# Patient Record
Sex: Male | Born: 1970
Health system: Southern US, Community
[De-identification: ages and names within clinical notes are randomized; demographics above are authoritative.]

## PROBLEM LIST (undated history)

## (undated) DIAGNOSIS — F419 Anxiety disorder, unspecified: Secondary | ICD-10-CM

## (undated) HISTORY — PX: REFRACTIVE SURGERY: SHX103

## (undated) HISTORY — DX: Anxiety disorder, unspecified: F41.9

## (undated) HISTORY — PX: VASECTOMY: SHX75

---

## 2005-05-02 HISTORY — PX: VASECTOMY: SHX75

## 2007-02-20 ENCOUNTER — Encounter: Payer: Self-pay | Admitting: Cardiology

## 2009-12-15 ENCOUNTER — Encounter: Payer: Self-pay | Admitting: Cardiology

## 2010-12-09 ENCOUNTER — Encounter: Payer: Self-pay | Admitting: Cardiology

## 2011-01-05 ENCOUNTER — Encounter: Payer: Self-pay | Admitting: Cardiology

## 2011-01-05 ENCOUNTER — Ambulatory Visit (INDEPENDENT_AMBULATORY_CARE_PROVIDER_SITE_OTHER): Payer: BC Managed Care – PPO | Admitting: Cardiology

## 2011-01-05 VITALS — BP 102/74 | HR 67 | Resp 16 | Ht 64.0 in | Wt 134.0 lb

## 2011-01-05 DIAGNOSIS — R9431 Abnormal electrocardiogram [ECG] [EKG]: Secondary | ICD-10-CM

## 2011-01-05 NOTE — Patient Instructions (Addendum)
Follow up as needed  The current medical regimen is effective;  continue present plan and medications.  

## 2011-01-05 NOTE — Assessment & Plan Note (Signed)
The patient has been right axis deviation and mildly on the AP. However, he has absolutely no physical findings to suggest any short external heart disease and in particular no findings consistent with elevated RV pressure or volume. He has no symptoms. He has no risk factors. Given this no further cardiovascular testing is suggested. I did review his EKG and compare to previous and is unchanged.

## 2011-01-05 NOTE — Progress Notes (Signed)
HPI The patient presents for evaluation of an abnormal EKG.  He was found to have RAD and the EKG suggested RVH.  The patient has had no past cardiac history. He has been quite active and works out aerobically 6 days a week. With a high lateral of exercise he denies any cardiovascular symptoms. He denies any chest pressure, neck or arm discomfort. He has no palpitations, presyncope or syncope. He said 25 pound intentional weight loss and exercise and diet. He has no swelling or edema. He has no shortness of breath, PND or orthopnea. An EKG repeated recently does demonstrate a mild right axis deviation probably consistent with his age and body habitus. He had some repolarization changes consistent with his age.  Allergies  Allergen Reactions  . Bee Venom     No current outpatient prescriptions on file.    Past Medical History  Diagnosis Date  . Allergic rhinitis     Past Surgical History  Procedure Date  . Vasectomy     No family history on file.  History   Social History  . Marital Status: N/A    Spouse Name: N/A    Number of Children: 2  . Years of Education: N/A   Occupational History  . [employer]    Social History Main Topics  . Smoking status: Former Games developer  . Smokeless tobacco: Not on file  . Alcohol Use: Not on file  . Drug Use: Not on file  . Sexually Active: Yes -- Male partner(s)   Other Topics Concern  . Not on file   Social History Narrative  . No narrative on file    ROS: Rare dizziness. Otherwise as stated in the HPI and negative for all other systems.  PHYSICAL EXAM BP 102/74  Pulse 67  Resp 16  Ht 5\' 4"  (1.626 m)  Wt 134 lb (60.782 kg)  BMI 23.00 kg/m2 GENERAL:  Well appearing HEENT:  Pupils equal round and reactive, fundi not visualized, oral mucosa unremarkable NECK:  No jugular venous distention, waveform within normal limits, carotid upstroke brisk and symmetric, no bruits, no thyromegaly LYMPHATICS:  No cervical, inguinal  adenopathy LUNGS:  Clear to auscultation bilaterally BACK:  No CVA tenderness CHEST:  Unremarkable HEART:  PMI not displaced or sustained,S1 and S2 within normal limits, no S3, no S4, no clicks, no rubs, no murmurs ABD:  Flat, positive bowel sounds normal in frequency in pitch, no bruits, no rebound, no guarding, no midline pulsatile mass, no hepatomegaly, no splenomegaly EXT:  2 plus pulses throughout, no edema, no cyanosis no clubbing SKIN:  No rashes no nodules NEURO:  Cranial nerves II through XII grossly intact, motor grossly intact throughout PSYCH:  Cognitively intact, oriented to person place and time   EKG:  Size, rate 61, rightward axis, early repolarization changes, no acute ST-T wave changes.  ASSESSMENT AND PLAN

## 2011-01-24 ENCOUNTER — Institutional Professional Consult (permissible substitution): Payer: Self-pay | Admitting: Cardiovascular Disease

## 2011-01-27 ENCOUNTER — Encounter: Payer: Self-pay | Admitting: Cardiology

## 2012-07-30 ENCOUNTER — Telehealth: Payer: Self-pay | Admitting: Nurse Practitioner

## 2012-07-30 ENCOUNTER — Other Ambulatory Visit: Payer: Self-pay | Admitting: Nurse Practitioner

## 2012-07-30 MED ORDER — VALACYCLOVIR HCL 1 G PO TABS: ORAL_TABLET | ORAL | Status: DC

## 2012-07-30 NOTE — Telephone Encounter (Signed)
Please advise 

## 2012-07-30 NOTE — Telephone Encounter (Signed)
[facility] [place]. Pts wife notified

## 2012-08-15 ENCOUNTER — Other Ambulatory Visit: Payer: Self-pay | Admitting: *Deleted

## 2012-08-15 MED ORDER — VALACYCLOVIR HCL 1 G PO TABS: ORAL_TABLET | ORAL | Status: DC

## 2013-02-20 ENCOUNTER — Ambulatory Visit: Payer: Self-pay

## 2013-09-10 ENCOUNTER — Other Ambulatory Visit: Payer: Self-pay

## 2013-09-10 MED ORDER — VALACYCLOVIR HCL 1 G PO TABS: ORAL_TABLET | ORAL | Status: DC

## 2013-09-10 NOTE — Telephone Encounter (Signed)
Not seen since EPIC 

## 2014-03-20 ENCOUNTER — Ambulatory Visit (INDEPENDENT_AMBULATORY_CARE_PROVIDER_SITE_OTHER): Payer: BC Managed Care – PPO

## 2014-03-20 DIAGNOSIS — Z23 Encounter for immunization: Secondary | ICD-10-CM

## 2014-08-01 ENCOUNTER — Other Ambulatory Visit: Payer: Self-pay | Admitting: Nurse Practitioner

## 2014-08-16 ENCOUNTER — Other Ambulatory Visit: Payer: Self-pay | Admitting: Nurse Practitioner

## 2014-08-22 ENCOUNTER — Ambulatory Visit (INDEPENDENT_AMBULATORY_CARE_PROVIDER_SITE_OTHER): Payer: BLUE CROSS/BLUE SHIELD | Admitting: Physician Assistant

## 2014-08-22 ENCOUNTER — Encounter: Payer: Self-pay | Admitting: Physician Assistant

## 2014-08-22 VITALS — BP 131/86 | HR 64 | Temp 97.4°F | Ht 64.0 in | Wt 151.0 lb

## 2014-08-22 DIAGNOSIS — B001 Herpesviral vesicular dermatitis: Secondary | ICD-10-CM

## 2014-08-22 MED ORDER — VALACYCLOVIR HCL 1 G PO TABS: ORAL_TABLET | ORAL | Status: DC

## 2014-08-22 NOTE — Progress Notes (Signed)
   Subjective:    Patient ID: Eric Gomez, male    DOB: 02/21/1971, 44 y.o.   MRN: [id number]  HPI 44 y/o male presents with c/o intermittent cold sores for several years. He hasn't been seen in office since 2013.    Review of Systems  Skin:       Ulcerative lesion on right lower lip with crusting        Objective:   Physical Exam  Skin:  Ulcerative, vesicular lesion on right lower lip with crusting           Assessment & Plan:  1. Herpes labialis  - valACYclovir (VALTREX) 1000 MG tablet; 2 po bid x 1 day  Dispense: 60 tablet; Refill: 1   RTO prn   Antaniya Venuti A. Chauncey ReadingGann PA-C

## 2014-12-25 ENCOUNTER — Encounter: Payer: Self-pay | Admitting: Family Medicine

## 2014-12-25 ENCOUNTER — Ambulatory Visit (INDEPENDENT_AMBULATORY_CARE_PROVIDER_SITE_OTHER): Payer: 59 | Admitting: Family Medicine

## 2014-12-25 VITALS — BP 122/83 | HR 60 | Temp 97.4°F | Ht 64.0 in | Wt 153.4 lb

## 2014-12-25 DIAGNOSIS — J0100 Acute maxillary sinusitis, unspecified: Secondary | ICD-10-CM

## 2014-12-25 MED ORDER — AMOXICILLIN-POT CLAVULANATE 875-125 MG PO TABS: 1.0000 | ORAL_TABLET | Freq: Two times a day (BID) | ORAL | Status: DC

## 2014-12-25 MED ORDER — PSEUDOEPHEDRINE-GUAIFENESIN ER 120-1200 MG PO TB12: 1.0000 | ORAL_TABLET | Freq: Two times a day (BID) | ORAL | Status: DC

## 2014-12-25 NOTE — Progress Notes (Signed)
Subjective:  Patient ID: Eric Gomez, male    DOB: [birth year]  Age: 44 y.o. MRN: [id number]  CC: URI   HPI Eric Gomez presents for 5 day of cough and congestion. No fever. Pt. used nasal wash with some success. Denies ear pain. Some facial pain. Scratchy throat   History Eric Gomez has a past medical history of Allergic rhinitis.   He has past surgical history that includes Vasectomy.   His family history is not on file.He reports that he has quit smoking. He does not have any smokeless tobacco history on file. His alcohol and drug histories are not on file.  Outpatient Prescriptions Prior to Visit  Medication Sig Dispense Refill  . valACYclovir (VALTREX) 1000 MG tablet 2 po bid x 1 day 60 tablet 1   No facility-administered medications prior to visit.    ROS Review of Systems  Constitutional: Negative for fever, chills, activity change and appetite change.  HENT: Positive for congestion, postnasal drip, rhinorrhea and sinus pressure. Negative for ear discharge, ear pain, hearing loss, nosebleeds, sneezing and trouble swallowing.   Respiratory: Negative for chest tightness and shortness of breath.   Cardiovascular: Negative for chest pain and palpitations.  Skin: Negative for rash.    Objective:  BP 122/83 mmHg  Pulse 60  Temp(Src) 97.4 F (36.3 C) (Oral)  Ht 5\' 4"  (1.626 m)  Wt 153 lb 6.4 oz (69.582 kg)  BMI 26.32 kg/m2  BP Readings from Last 3 Encounters:  12/25/14 122/83  08/22/14 131/86  01/05/11 102/74    Wt Readings from Last 3 Encounters:  12/25/14 153 lb 6.4 oz (69.582 kg)  08/22/14 151 lb (68.493 kg)  01/05/11 134 lb (60.782 kg)     Physical Exam  Constitutional: He appears well-developed and well-nourished.  HENT:  Head: Normocephalic and atraumatic.  Right Ear: Tympanic membrane and external ear normal. No decreased hearing is noted.  Left Ear: Tympanic membrane and external ear normal. No decreased hearing is noted.  Nose: Mucosal edema  present. Right sinus exhibits no frontal sinus tenderness. Left sinus exhibits no frontal sinus tenderness.  Mouth/Throat: No oropharyngeal exudate or posterior oropharyngeal erythema.  Neck: No Brudzinski's sign noted.  Pulmonary/Chest: Breath sounds normal. No respiratory distress.  Lymphadenopathy:       Head (right side): No preauricular adenopathy present.       Head (left side): No preauricular adenopathy present.       Right cervical: No superficial cervical adenopathy present.      Left cervical: No superficial cervical adenopathy present.    No results found for: HGBA1C  No results found for: WBC, HGB, HCT, PLT, GLUCOSE, CHOL, TRIG, HDL, LDLDIRECT, LDLCALC, ALT, AST, NA, K, CL, CREATININE, BUN, CO2, TSH, PSA, INR, GLUF, HGBA1C, MICROALBUR  Patient was never admitted.  Assessment & Plan:   Boone was seen today for uri.  Diagnoses and all orders for this visit:  Acute maxillary sinusitis, recurrence not specified  Other orders -     amoxicillin-clavulanate (AUGMENTIN) 875-125 MG per tablet; Take 1 tablet by mouth 2 (two) times daily. Take all of this medication -     Pseudoephedrine-Guaifenesin [phone] MG TB12; Take 1 tablet by mouth 2 (two) times daily. For congeestion   I am having Eric Gomez start on amoxicillin-clavulanate and Pseudoephedrine-Guaifenesin. I am also having him maintain his valACYclovir.  Meds ordered this encounter  Medications  . amoxicillin-clavulanate (AUGMENTIN) 875-125 MG per tablet    Sig: Take 1 tablet by mouth 2 (two) times daily.  Take all of this medication    Dispense:  20 tablet    Refill:  0  . Pseudoephedrine-Guaifenesin [phone] MG TB12    Sig: Take 1 tablet by mouth 2 (two) times daily. For congeestion    Dispense:  20 each    Refill:  0     Follow-up: Return if symptoms worsen or fail to improve, for CPE.  Eric Gomez, M.D.

## 2015-07-06 ENCOUNTER — Telehealth: Payer: Self-pay | Admitting: Nurse Practitioner

## 2015-07-06 NOTE — Telephone Encounter (Signed)
lmtcb

## 2015-07-06 NOTE — Telephone Encounter (Signed)
Pt called c/o of what he things is a sinus infection. Pt was seen 6 months ago but wanted me to ask you if you could call in a rx. He is having facial sinus pressure, nasal congestion and drainage. Pt states that if needed he will be seen but he really didn't want to miss work. Advised pt that i would ask you. Please advise

## 2015-07-06 NOTE — Telephone Encounter (Signed)
Has not been seen in 6 months- NTBS

## 2015-07-07 NOTE — Telephone Encounter (Signed)
Left detailed message that he will need to schedule an appt for evaluation.

## 2016-01-11 ENCOUNTER — Ambulatory Visit (INDEPENDENT_AMBULATORY_CARE_PROVIDER_SITE_OTHER): Payer: 59 | Admitting: Family Medicine

## 2016-01-11 ENCOUNTER — Encounter: Payer: Self-pay | Admitting: Family Medicine

## 2016-01-11 VITALS — BP 130/81 | HR 93 | Temp 97.2°F | Ht 64.0 in | Wt 167.0 lb

## 2016-01-11 DIAGNOSIS — J019 Acute sinusitis, unspecified: Secondary | ICD-10-CM

## 2016-01-11 DIAGNOSIS — M20012 Mallet finger of left finger(s): Secondary | ICD-10-CM | POA: Diagnosis not present

## 2016-01-11 MED ORDER — FLUTICASONE PROPIONATE 50 MCG/ACT NA SUSP: 1.0000 | Freq: Two times a day (BID) | NASAL | 6 refills | Status: DC | PRN

## 2016-01-11 NOTE — Progress Notes (Signed)
BP 130/81   Pulse 93   Temp 97.2 F (36.2 C) (Oral)   Ht 5\' 4"  (1.626 m)   Wt 167 lb (75.8 kg)   BMI 28.67 kg/m    Subjective:    Patient ID: Eric Gomez, male    DOB: Feb 12, 1971, 45 y.o.   MRN: [id number]  HPI: Eric Gomez is a 45 y.o. male presenting on 01/11/2016 for Sinusitis (sinus congestion and pressure, ear pain, headache, drainage, sore throat)   HPI Sinus congestion and ear pressure Patient has been having sinus congestion and ear pressure that is been going on for the past 2 days. He feels like it is been worsening now is getting drainage down the back of his throat starting to get a sore throat. He denies any fevers or chills or shortness of breath or wheezing. He has tried some over-the-counter Tylenol and ibuprofen but nothing else to this point. He is coming in mainly because last time he waited and it got so bad antibiotic and is hoping to catch it before that point. He denies any sick contacts that he knows of.  Relevant past medical, surgical, family and social history reviewed and updated as indicated. Interim medical history since our last visit reviewed. Allergies and medications reviewed and updated.  Review of Systems  Constitutional: Negative for chills and fever.  HENT: Positive for congestion, postnasal drip, rhinorrhea, sinus pressure, sneezing and sore throat. Negative for ear discharge, ear pain and voice change.   Eyes: Negative for pain, discharge, redness and visual disturbance.  Respiratory: Positive for cough. Negative for shortness of breath and wheezing.   Cardiovascular: Negative for chest pain and leg swelling.  Musculoskeletal: Negative for back pain and gait problem.  Skin: Negative for rash.  All other systems reviewed and are negative.   Per HPI unless specifically indicated above     Medication List       Accurate as of 01/11/16  6:46 PM. Always use your most recent med list.          fluticasone 50 MCG/ACT nasal  spray Commonly known as:  FLONASE Place 1 spray into both nostrils 2 (two) times daily as needed for allergies or rhinitis.          Objective:    BP 130/81   Pulse 93   Temp 97.2 F (36.2 C) (Oral)   Ht 5\' 4"  (1.626 m)   Wt 167 lb (75.8 kg)   BMI 28.67 kg/m   Wt Readings from Last 3 Encounters:  01/11/16 167 lb (75.8 kg)  12/25/14 153 lb 6.4 oz (69.6 kg)  08/22/14 151 lb (68.5 kg)    Physical Exam  Constitutional: He is oriented to person, place, and time. He appears well-developed and well-nourished. No distress.  HENT:  Right Ear: Tympanic membrane, external ear and ear canal normal.  Left Ear: Tympanic membrane, external ear and ear canal normal.  Nose: Mucosal edema and rhinorrhea present. No sinus tenderness. No epistaxis. Right sinus exhibits maxillary sinus tenderness. Right sinus exhibits no frontal sinus tenderness. Left sinus exhibits maxillary sinus tenderness. Left sinus exhibits no frontal sinus tenderness.  Mouth/Throat: Uvula is midline and mucous membranes are normal. Posterior oropharyngeal edema and posterior oropharyngeal erythema present. No oropharyngeal exudate or tonsillar abscesses.  Eyes: Conjunctivae and EOM are normal. Pupils are equal, round, and reactive to light. Right eye exhibits no discharge. No scleral icterus.  Neck: Neck supple. No thyromegaly present.  Cardiovascular: Normal rate, regular rhythm, normal heart sounds and  intact distal pulses.   No murmur heard. Pulmonary/Chest: Effort normal and breath sounds normal. No respiratory distress. He has no wheezes. He has no rales.  Musculoskeletal: Normal range of motion. He exhibits no edema.  Lymphadenopathy:    He has no cervical adenopathy.  Neurological: He is alert and oriented to person, place, and time. Coordination normal.  Skin: Skin is warm and dry. No rash noted. He is not diaphoretic.  Psychiatric: He has a normal mood and affect. His behavior is normal.  Nursing note and vitals  reviewed.   No results found for this or any previous visit.    Assessment & Plan:   Problem List Items Addressed This Visit    None    Visit Diagnoses    Acquired mallet finger of left hand    -  Primary   Left ring finger, place splint, instructed patient to wear for 6 weeks all of the time   Acute rhinosinusitis       Relevant Medications   fluticasone (FLONASE) 50 MCG/ACT nasal spray       Follow up plan: Return in about 6 weeks (around 02/22/2016), or if symptoms worsen or fail to improve, for Follow-up mouth finger.  Counseling provided for all of the vaccine components No orders of the defined types were placed in this encounter.   Arville CareJoshua Jaykwon Morones, MD [facility] 01/11/2016, 6:46 PM

## 2016-01-13 ENCOUNTER — Telehealth: Payer: Self-pay | Admitting: *Deleted

## 2016-01-13 NOTE — Telephone Encounter (Signed)
Pt in no better - needs some relief: Symptoms:  Cough Congestion - dark yellow/ white Sinus pressure- bad Sore throat from drainage No fever   Seen Monday - dettinger - gave flonase - using the mucinex and other otc -- [facility] and may need work note. Pt aware he will get a call on Thursday AM

## 2016-01-14 MED ORDER — AZITHROMYCIN 250 MG PO TABS: ORAL_TABLET | ORAL | 0 refills | Status: DC

## 2016-01-14 NOTE — Telephone Encounter (Signed)
Pt aware.

## 2016-02-05 ENCOUNTER — Other Ambulatory Visit: Payer: Self-pay | Admitting: Nurse Practitioner

## 2016-04-01 ENCOUNTER — Ambulatory Visit (INDEPENDENT_AMBULATORY_CARE_PROVIDER_SITE_OTHER): Payer: 59 | Admitting: Family Medicine

## 2016-04-01 ENCOUNTER — Encounter: Payer: Self-pay | Admitting: Family Medicine

## 2016-04-01 VITALS — BP 125/81 | HR 82 | Temp 98.0°F | Ht 64.0 in | Wt 164.2 lb

## 2016-04-01 DIAGNOSIS — M25561 Pain in right knee: Secondary | ICD-10-CM | POA: Diagnosis not present

## 2016-04-01 MED ORDER — DICLOFENAC SODIUM 75 MG PO TBEC: 75.0000 mg | DELAYED_RELEASE_TABLET | Freq: Two times a day (BID) | ORAL | 0 refills | Status: DC

## 2016-04-01 NOTE — Progress Notes (Signed)
   HPI  Patient presents today with right knee pain.  Patient explains that he had slight knee soreness over the last day or 2, however last night around 10 PM his right knee began to hurt severely. He describes dull achy persistent anterior knee pain. Worse with walking or bending the knee. He denies any injury, he did work on his knees about one week ago on carpet.  He has not had similar pain before.  He denies any recent increased meat consumption or alcohol use.  The current operative situation, he was offered a job and will need to get a physical exam within 2 days of except in the jaw so he needs a note for work.  PMH: Smoking status noted ROS: Per HPI  Objective: BP 125/81   Pulse 82   Temp 98 F (36.7 C) (Oral)   Ht 5\' 4"  (1.626 m)   Wt 164 lb 3.2 oz (74.5 kg)   BMI 28.18 kg/m  Gen: NAD, alert, cooperative with exam HEENT: NCAT CV: RRR, good S1/S2, no murmur Resp: CTABL, no wheezes, non-labored Ext: No edema, warm Neuro: Alert and oriented, No gross deficits  MSK: R knee without effusion, bruising, or gross deformity No joint line tenderness.  Mild erythema and warmth compared to L ligamentously intact to Lachman's and with varus and valgus stress.  Negative McMurray's test   Assessment and plan:  # Right knee pain Acute right knee pain onset in last night. Very mild erythema and warmth, consider unusual gout flare, also consider quadriceps tendinitis given distribution of pain in the anterior knee. NSAIDs, supportive care discussed Note written for his job offer Offered x-ray, however he will wait to see if this does not improve.   Meds ordered this encounter  Medications  . diclofenac (VOLTAREN) 75 MG EC tablet    Sig: Take 1 tablet (75 mg total) by mouth 2 (two) times daily.    Dispense:  28 tablet    Refill:  0    Murtis SinkSam Takita Riecke, MD [facility] 04/01/2016, 8:53 AM

## 2016-04-01 NOTE — Patient Instructions (Addendum)
Great to meet you!  Try the medication I gave your 2 times daily for at least 3-5 days, you may take it for up to 2 weeks Also try ice for 15 minutes 3-4 times a day, elevating the knee, and compression ( a knee sleeve or ace bandage is good).   Come back early next week if you are not improving as expected.   Get help right away if you develop fever or the redness is spreading in your leg.

## 2016-10-20 ENCOUNTER — Ambulatory Visit (INDEPENDENT_AMBULATORY_CARE_PROVIDER_SITE_OTHER): Payer: Commercial Managed Care - PPO | Admitting: Family Medicine

## 2016-10-20 ENCOUNTER — Encounter: Payer: Self-pay | Admitting: Family Medicine

## 2016-10-20 VITALS — BP 134/89 | HR 67 | Temp 98.1°F | Ht 64.0 in | Wt 168.0 lb

## 2016-10-20 DIAGNOSIS — L03113 Cellulitis of right upper limb: Secondary | ICD-10-CM | POA: Diagnosis not present

## 2016-10-20 MED ORDER — SULFAMETHOXAZOLE-TRIMETHOPRIM 800-160 MG PO TABS: 1.0000 | ORAL_TABLET | Freq: Two times a day (BID) | ORAL | 0 refills | Status: DC

## 2016-10-20 NOTE — Progress Notes (Signed)
BP 134/89   Pulse 67   Temp 98.1 F (36.7 C) (Oral)   Ht 5\' 4"  (1.626 m)   Wt 168 lb (76.2 kg)   BMI 28.84 kg/m    Subjective:    Patient ID: Eric Gomez, male    DOB: [birth year], 46 y.o.   MRN: [id number]  HPI: Eric Gomez is a 46 y.o. male presenting on 10/20/2016 for Elbow Pain (pt here today c/o right elbow pain/swelling with redness around the elbow and down the forearm. Pt states it is improving from a few nights ago.)   HPI Elbow pain Patient has developed significant right elbow pain that overlying the skin on the back surface of his elbow. It started a few days ago from a small cut that he had on his elbow. He says the overlying skin on that part of the elbow has been inflamed and painful. He denies any fevers or chills. He says it has come down over the past couple days with ibuprofen. He denies any loss of range of motion or pain with range of motion.  Relevant past medical, surgical, family and social history reviewed and updated as indicated. Interim medical history since our last visit reviewed. Allergies and medications reviewed and updated.  Review of Systems  Constitutional: Negative for chills and fever.  Respiratory: Negative for shortness of breath and wheezing.   Cardiovascular: Negative for chest pain and leg swelling.  Musculoskeletal: Negative for back pain and gait problem.  Skin: Positive for color change and wound. Negative for rash.  All other systems reviewed and are negative.   Per HPI unless specifically indicated above        Objective:    BP 134/89   Pulse 67   Temp 98.1 F (36.7 C) (Oral)   Ht 5\' 4"  (1.626 m)   Wt 168 lb (76.2 kg)   BMI 28.84 kg/m   Wt Readings from Last 3 Encounters:  10/20/16 168 lb (76.2 kg)  04/01/16 164 lb 3.2 oz (74.5 kg)  01/11/16 167 lb (75.8 kg)    Physical Exam  Constitutional: He is oriented to person, place, and time. He appears well-developed and well-nourished. No distress.  Eyes:  Conjunctivae are normal. No scleral icterus.  Musculoskeletal: Normal range of motion. He exhibits no edema or tenderness.  Neurological: He is alert and oriented to person, place, and time. Coordination normal.  Skin: Skin is warm and dry. No rash noted. He is not diaphoretic. There is erythema (Erythema overlying the right posterior aspect of the elbow, no inflammation, able to move joint without pain, palpitation overlying skin is tender. Patient has small cut that may have been where the wound started as tender around the cut. Appears to be hea).  Psychiatric: He has a normal mood and affect. His behavior is normal.  Nursing note and vitals reviewed.  appears to be healing well  No results found for this or any previous visit.    Assessment & Plan:   Problem List Items Addressed This Visit    None    Visit Diagnoses    Cellulitis of right upper extremity    -  Primary   Overlying right elbow, some concern that it may be in the bursa sac, will try with antibiotic but it does not improve may need orthopedic referral   Relevant Medications   sulfamethoxazole-trimethoprim (BACTRIM DS,SEPTRA DS) 800-160 MG tablet       Follow up plan: Return if symptoms worsen or fail to improve.  Counseling provided for all of the vaccine components No orders of the defined types were placed in this encounter.   Arville CareJoshua Tupac Jeffus, MD [facility] 10/20/2016, 9:07 AM

## 2016-10-21 ENCOUNTER — Encounter: Payer: Self-pay | Admitting: Physician Assistant

## 2016-10-21 ENCOUNTER — Ambulatory Visit: Payer: Commercial Managed Care - PPO | Admitting: Physician Assistant

## 2016-10-21 ENCOUNTER — Ambulatory Visit (INDEPENDENT_AMBULATORY_CARE_PROVIDER_SITE_OTHER): Payer: Commercial Managed Care - PPO | Admitting: Physician Assistant

## 2016-10-21 VITALS — BP 128/87 | HR 70 | Temp 97.5°F | Ht 64.0 in | Wt 165.8 lb

## 2016-10-21 DIAGNOSIS — M25521 Pain in right elbow: Secondary | ICD-10-CM | POA: Insufficient documentation

## 2016-10-21 MED ORDER — INDOMETHACIN 50 MG PO CAPS: 50.0000 mg | ORAL_CAPSULE | Freq: Three times a day (TID) | ORAL | 0 refills | Status: DC | PRN

## 2016-10-21 NOTE — Patient Instructions (Signed)

## 2016-10-21 NOTE — Progress Notes (Signed)
Subjective:     Patient ID: Eric Gomez, male   DOB: 04/24/1971, 46 y.o.   MRN: [id number]  HPI Pt here for f/u of  R elbow pain Recently seen for same- see note He noted a increase in swelling after work this am Question possible injection to the elbow Has used some Voltaren with some relief today  Review of Systems + redness, swelling, and pain to the elbow No distal numbness     Objective:   Physical Exam + erythema and edema to the R elbow Erythema is diffuse No induration No sig bursa edema seen FROM of the elbow Good grip strength distal Pulses/sensory good distal Uric Acid pending    Assessment:     1. Right elbow pain        Plan:     Discussed with pt I did not want to do injection due to possible infection He denies any FH of gout  Cool compresses Continue with ATB Hold Voltaren and trial of Indocin 50mg  tid prn Will inform of lab results Hydrate well F/U prn

## 2016-10-22 LAB — URIC ACID: Uric Acid: 6.3 mg/dL (ref 3.7–8.6)

## 2016-10-31 NOTE — Progress Notes (Signed)
Patient aware.

## 2017-01-13 ENCOUNTER — Encounter: Payer: Self-pay | Admitting: Family Medicine

## 2017-01-13 ENCOUNTER — Ambulatory Visit (INDEPENDENT_AMBULATORY_CARE_PROVIDER_SITE_OTHER): Payer: Commercial Managed Care - PPO | Admitting: Family Medicine

## 2017-01-13 DIAGNOSIS — F339 Major depressive disorder, recurrent, unspecified: Secondary | ICD-10-CM | POA: Insufficient documentation

## 2017-01-13 MED ORDER — FLUOXETINE HCL 20 MG PO TABS: 20.0000 mg | ORAL_TABLET | Freq: Every day | ORAL | 1 refills | Status: DC

## 2017-01-13 MED ORDER — HYDROXYZINE HCL 25 MG PO TABS: 25.0000 mg | ORAL_TABLET | Freq: Three times a day (TID) | ORAL | 0 refills | Status: DC | PRN

## 2017-01-13 NOTE — Progress Notes (Signed)
BP 128/81   Pulse 97   Temp (!) 97.4 F (36.3 C) (Oral)   Ht  (1.626 m)   Wt 163 lb (73.9 kg)   BMI 27.98 kg/m    Subjective:    Patient ID: Eric Gomez, male    DOB: 05/06/1970, 46 y.o.   MRN: [id number]  HPI: Eric Gomez is a 46 y.o. male presenting on 01/13/2017 for Depression   HPI Anxiety and depression Patient is coming in today to discuss anxiety and depression. He says this is something that he has follow-up with prolonged time in his life but has recently worsened over the past year. He lost his mother just within the past year and has had some otherwise difficulties that it brought it down and he just feels sad most of the time and has crying episodes and spells and just does not feel like himself. He says he also has some panic attacks and anxiety attacks that have been going on. He feels hopeless and helpless and has thoughts of death but no suicidal ideations. He says that he has to much to live for and that is what is bringing him in today. He has close family and children and a wife that he lives with that he supports to keep him going on a day-to-day basis. Depression screen [facility] 2/9 01/13/2017 10/21/2016 10/20/2016 04/01/2016 01/11/2016  Decreased Interest 3 0 0 0 0  Down, Depressed, Hopeless 3 0 0 0 0  PHQ - 2 Score 6 0 0 0 0  Altered sleeping 3 - - - -  Tired, decreased energy 3 - - - -  Change in appetite 3 - - - -  Feeling bad or failure about yourself  3 - - - -  Trouble concentrating 3 - - - -  Moving slowly or fidgety/restless 3 - - - -  Suicidal thoughts 3 - - - -  PHQ-9 Score 27 - - - -  Difficult doing work/chores Extremely dIfficult - - - -     Relevant past medical, surgical, family and social history reviewed and updated as indicated. Interim medical history since our last visit reviewed. Allergies and medications reviewed and updated.  Review of Systems  Constitutional: Negative for chills and fever.  Eyes: Negative for discharge.    Respiratory: Negative for shortness of breath and wheezing.   Cardiovascular: Negative for chest pain and leg swelling.  Musculoskeletal: Negative for back pain and gait problem.  Skin: Negative for rash.  Psychiatric/Behavioral: Positive for decreased concentration, dysphoric mood, sleep disturbance and suicidal ideas. Negative for self-injury. The patient is nervous/anxious.   All other systems reviewed and are negative.   Per HPI unless specifically indicated above        Objective:    BP 128/81   Pulse 97   Temp (!) 97.4 F (36.3 C) (Oral)   Ht  (1.626 m)   Wt 163 lb (73.9 kg)   BMI 27.98 kg/m   Wt Readings from Last 3 Encounters:  01/13/17 163 lb (73.9 kg)  10/21/16 165 lb 12.8 oz (75.2 kg)  10/20/16 168 lb (76.2 kg)    Physical Exam  Constitutional: He is oriented to person, place, and time. He appears well-developed and well-nourished. No distress.  Eyes: Conjunctivae are normal. No scleral icterus.  Cardiovascular: Normal rate, regular rhythm, normal heart sounds and intact distal pulses.   No murmur heard. Pulmonary/Chest: Effort normal and breath sounds normal. No respiratory distress. He has no wheezes. He  has no rales.  Musculoskeletal: Normal range of motion. He exhibits no edema.  Neurological: He is alert and oriented to person, place, and time. Coordination normal.  Skin: Skin is warm and dry. No rash noted. He is not diaphoretic.  Psychiatric: His behavior is normal. Judgment normal. His mood appears anxious. He exhibits a depressed mood. He expresses suicidal ideation. He expresses no suicidal plans.  Nursing note and vitals reviewed.       Assessment & Plan:   Problem List Items Addressed This Visit      Other   Depression, recurrent (HCC)   Relevant Medications   FLUoxetine (PROZAC) 20 MG tablet   hydrOXYzine (ATARAX/VISTARIL) 25 MG tablet   Other Relevant Orders   CBC with Differential/Platelet (Completed)   TSH (Completed)        Follow up plan: Return in about 4 weeks (around 02/10/2017), or if symptoms worsen or fail to improve, for Recheck depression.  Counseling provided for all of the vaccine components Orders Placed This Encounter  Procedures  . CBC with Differential/Platelet  . TSH    Arville Care, MD [facility] 01/13/2017, 4:39 PM

## 2017-01-14 LAB — CBC WITH DIFFERENTIAL/PLATELET
BASOS ABS: 0.1 10*3/uL (ref 0.0–0.2)
Basos: 1 %
EOS (ABSOLUTE): 0.2 10*3/uL (ref 0.0–0.4)
Eos: 3 %
Hematocrit: 42.2 % (ref 37.5–51.0)
Hemoglobin: 14.5 g/dL (ref 13.0–17.7)
Immature Grans (Abs): 0 10*3/uL (ref 0.0–0.1)
Immature Granulocytes: 0 %
LYMPHS ABS: 1.7 10*3/uL (ref 0.7–3.1)
Lymphs: 31 %
MCH: 28.7 pg (ref 26.6–33.0)
MCHC: 34.4 g/dL (ref 31.5–35.7)
MCV: 84 fL (ref 79–97)
MONOS ABS: 0.5 10*3/uL (ref 0.1–0.9)
Monocytes: 9 %
NEUTROS ABS: 3.2 10*3/uL (ref 1.4–7.0)
Neutrophils: 56 %
Platelets: 257 10*3/uL (ref 150–379)
RBC: 5.05 x10E6/uL (ref 4.14–5.80)
RDW: 14.2 % (ref 12.3–15.4)
WBC: 5.6 10*3/uL (ref 3.4–10.8)

## 2017-01-14 LAB — TSH: TSH: 1.68 u[IU]/mL (ref 0.450–4.500)

## 2017-11-17 ENCOUNTER — Ambulatory Visit (INDEPENDENT_AMBULATORY_CARE_PROVIDER_SITE_OTHER): Payer: Commercial Managed Care - PPO | Admitting: Family Medicine

## 2017-11-17 ENCOUNTER — Encounter: Payer: Self-pay | Admitting: Family Medicine

## 2017-11-17 VITALS — BP 128/84 | HR 67 | Temp 97.7°F | Ht 64.0 in | Wt 148.0 lb

## 2017-11-17 DIAGNOSIS — F339 Major depressive disorder, recurrent, unspecified: Secondary | ICD-10-CM

## 2017-11-17 MED ORDER — SERTRALINE HCL 50 MG PO TABS: 50.0000 mg | ORAL_TABLET | Freq: Every day | ORAL | 1 refills | Status: DC

## 2017-11-17 MED ORDER — HYDROXYZINE HCL 25 MG PO TABS: 25.0000 mg | ORAL_TABLET | Freq: Three times a day (TID) | ORAL | 1 refills | Status: DC | PRN

## 2017-11-17 NOTE — Progress Notes (Signed)
BP 128/84 (BP Location: Left Arm)   Pulse 67   Temp 97.7 F (36.5 C) (Oral)   Ht 5\' 4"  (1.626 m)   Wt 148 lb (67.1 kg)   BMI 25.40 kg/m    Subjective:    Patient ID: Eric Gomez, male    DOB: Oct 28, 1970, 47 y.o.   MRN: [id number]  HPI: Eric Gomez is a 47 y.o. male presenting on 11/17/2017 for Anxiety (stress and depression )   HPI Anxiety depression recheck Patient is coming in today for anxiety depression recheck.  He says that he has a lot of anxiety and stress that is been something that has bothered him most of his life.  He says it is building up to the point where he feels like he cannot control it again and his wife gave him an ultimatum to come in and be seen for it.  He says that he took the Prozac but only for 2 weeks and does not know if he fully give it a chance and then stopped it but has been taking the hydroxyzine as needed when his stress builds up to a very high point.  Both his daughter and his wife are being treated for similar anxiety and stress and depression as well.  He denies any major triggers that could have brought this on but just has been more like this most of his life. Depression screen [facility] 2/9 11/17/2017 01/13/2017 10/21/2016 10/20/2016 04/01/2016  Decreased Interest 3 3 0 0 0  Down, Depressed, Hopeless 2 3 0 0 0  PHQ - 2 Score 5 6 0 0 0  Altered sleeping 3 3 - - -  Tired, decreased energy 0 3 - - -  Change in appetite 0 3 - - -  Feeling bad or failure about yourself  2 3 - - -  Trouble concentrating 2 3 - - -  Moving slowly or fidgety/restless 0 3 - - -  Suicidal thoughts 0 3 - - -  PHQ-9 Score 12 27 - - -  Difficult doing work/chores Somewhat difficult Extremely dIfficult - - -     Relevant past medical, surgical, family and social history reviewed and updated as indicated. Interim medical history since our last visit reviewed. Allergies and medications reviewed and updated.  Review of Systems  Constitutional: Negative for chills and fever.    Respiratory: Negative for shortness of breath and wheezing.   Cardiovascular: Negative for chest pain and leg swelling.  Musculoskeletal: Negative for back pain and gait problem.  Skin: Negative for rash.  Psychiatric/Behavioral: Positive for decreased concentration, dysphoric mood and sleep disturbance. Negative for self-injury and suicidal ideas. The patient is nervous/anxious.   All other systems reviewed and are negative.   Per HPI unless specifically indicated above   Allergies as of 11/17/2017      Reactions   Bee Venom       Medication List    as of 11/17/2017  1:36 PM   You have not been prescribed any medications.        Objective:    BP 128/84 (BP Location: Left Arm)   Pulse 67   Temp 97.7 F (36.5 C) (Oral)   Ht 5\' 4"  (1.626 m)   Wt 148 lb (67.1 kg)   BMI 25.40 kg/m   Wt Readings from Last 3 Encounters:  11/17/17 148 lb (67.1 kg)  01/13/17 163 lb (73.9 kg)  10/21/16 165 lb 12.8 oz (75.2 kg)    Physical Exam  Constitutional:  He is oriented to person, place, and time. He appears well-developed and well-nourished. No distress.  Eyes: Conjunctivae are normal. No scleral icterus.  Neck: Neck supple. No thyromegaly present.  Cardiovascular: Normal rate, regular rhythm, normal heart sounds and intact distal pulses.  No murmur heard. Pulmonary/Chest: Effort normal and breath sounds normal. No respiratory distress. He has no wheezes.  Lymphadenopathy:    He has no cervical adenopathy.  Neurological: He is alert and oriented to person, place, and time. Coordination normal.  Skin: Skin is warm and dry. No rash noted. He is not diaphoretic.  Psychiatric: His behavior is normal. His mood appears anxious. He exhibits a depressed mood. He expresses no suicidal ideation. He expresses no suicidal plans.  Nursing note and vitals reviewed.       Assessment & Plan:   Problem List Items Addressed This Visit      Other   Depression, recurrent (HCC) - Primary    Relevant Medications   sertraline (ZOLOFT) 50 MG tablet   hydrOXYzine (ATARAX/VISTARIL) 25 MG tablet      Will start on Zoloft and see back in 4 weeks, hydroxyzine as needed as well. Follow up plan: Return in about 1 month (around 12/15/2017), or if symptoms worsen or fail to improve, for Depression and anxiety recheck.  Counseling provided for all of the vaccine components No orders of the defined types were placed in this encounter.   Arville CareJoshua Dettinger, MD [facility] 11/17/2017, 1:36 PM

## 2018-04-19 ENCOUNTER — Ambulatory Visit (INDEPENDENT_AMBULATORY_CARE_PROVIDER_SITE_OTHER): Payer: Commercial Managed Care - PPO | Admitting: Family Medicine

## 2018-04-19 ENCOUNTER — Encounter: Payer: Self-pay | Admitting: Family Medicine

## 2018-04-19 DIAGNOSIS — F339 Major depressive disorder, recurrent, unspecified: Secondary | ICD-10-CM | POA: Diagnosis not present

## 2018-04-19 MED ORDER — HYDROXYZINE HCL 25 MG PO TABS: 25.0000 mg | ORAL_TABLET | Freq: Three times a day (TID) | ORAL | 1 refills | Status: DC | PRN

## 2018-04-19 MED ORDER — BUPROPION HCL ER (XL) 150 MG PO TB24: 150.0000 mg | ORAL_TABLET | Freq: Every day | ORAL | 1 refills | Status: DC

## 2018-04-19 MED ORDER — VALACYCLOVIR HCL 1 G PO TABS: 1000.0000 mg | ORAL_TABLET | Freq: Two times a day (BID) | ORAL | 0 refills | Status: DC

## 2018-04-19 NOTE — Progress Notes (Signed)
BP 127/84   Pulse 69   Temp (!) 97.2 F (36.2 C) (Oral)   Ht 5\' 4"  (1.626 m)   Wt 162 lb 12.8 oz (73.8 kg)   BMI 27.94 kg/m    Subjective:    Patient ID: Eric Gomez, male    DOB: 1970/08/31, 47 y.o.   MRN: [id number]  HPI: Eric Gomez is a 47 y.o. male presenting on 04/19/2018 for Depression (check up of chronic medical conditions- patient wants a rx for valtrex)   HPI Anxiety and depression recheck Patient is coming in to discuss anxiety and depression.  He says that he admits that he was drinking a lot of alcohol and self-medicating himself with the alcohol for his depression and that was keeping everything and at bay but 3 weeks ago he was finally having off and he decided to quit drinking and has already gone through the physical withdrawals but now he is dealing with a lot of emotional issues that are rising up that he had suppressed previously but now is having to deal with.  He says his emotions are building up a lot and his depression is building up a lot and it is affecting his work and himself but he has been using the hydroxyzine that he had before and it has been helping him sleep and keep the anxiety at bay and help him with not wanting to drink alcohol again.  He would like to try something else again [facility] 2/9 04/19/2018 11/17/2017 01/13/2017 10/21/2016 10/20/2016  Decreased Interest 2 3 3  0 0  Down, Depressed, Hopeless 2 2 3  0 0  PHQ - 2 Score 4 5 6  0 0  Altered sleeping 2 3 3  - -  Tired, decreased energy 2 0 3 - -  Change in appetite 2 0 3 - -  Feeling bad or failure about yourself  2 2 3  - -  Trouble concentrating 0 2 3 - -  Moving slowly or fidgety/restless 0 0 3 - -  Suicidal thoughts 0 0 3 - -  PHQ-9 Score 12 12 27  - -  Difficult doing work/chores - Somewhat difficult Extremely dIfficult - -    Relevant past medical, surgical, family and social history reviewed and updated as indicated. Interim medical history since our last visit  reviewed. Allergies and medications reviewed and updated.  Review of Systems  Constitutional: Negative for chills and fever.  Respiratory: Negative for shortness of breath and wheezing.   Cardiovascular: Negative for chest pain and leg swelling.  Musculoskeletal: Negative for back pain and gait problem.  Skin: Negative for rash.  Psychiatric/Behavioral: Positive for dysphoric mood. Negative for self-injury, sleep disturbance and suicidal ideas. The patient is nervous/anxious.   All other systems reviewed and are negative.   Per HPI unless specifically indicated above   Allergies as of 04/19/2018      Reactions   Bee Venom       Medication List       Accurate as of April 19, 2018 11:44 AM. Always use your most recent med list.        buPROPion 150 MG 24 hr tablet Commonly known as:  WELLBUTRIN XL Take 1 tablet (150 mg total) by mouth daily.   hydrOXYzine 25 MG tablet Commonly known as:  ATARAX/VISTARIL Take 1 tablet (25 mg total) by mouth 3 (three) times daily as needed.   valACYclovir 1000 MG tablet Commonly known as:  VALTREX Take 1 tablet (1,000 mg total) by mouth 2 (  two) times daily.          Objective:    BP 127/84   Pulse 69   Temp (!) 97.2 F (36.2 C) (Oral)   Ht 5\' 4"  (1.626 m)   Wt 162 lb 12.8 oz (73.8 kg)   BMI 27.94 kg/m   Wt Readings from Last 3 Encounters:  04/19/18 162 lb 12.8 oz (73.8 kg)  11/17/17 148 lb (67.1 kg)  01/13/17 163 lb (73.9 kg)    Physical Exam Vitals signs and nursing note reviewed.  Constitutional:      General: He is not in acute distress.    Appearance: He is well-developed. He is not diaphoretic.  Eyes:     General: No scleral icterus.    Conjunctiva/sclera: Conjunctivae normal.  Neck:     Musculoskeletal: Neck supple.     Thyroid: No thyromegaly.  Cardiovascular:     Rate and Rhythm: Normal rate and regular rhythm.     Heart sounds: Normal heart sounds. No murmur.  Pulmonary:     Effort: Pulmonary effort is  normal. No respiratory distress.     Breath sounds: Normal breath sounds. No wheezing.  Musculoskeletal: Normal range of motion.  Lymphadenopathy:     Cervical: No cervical adenopathy.  Skin:    General: Skin is warm and dry.     Findings: No rash.  Neurological:     Mental Status: He is alert and oriented to person, place, and time.     Coordination: Coordination normal.  Psychiatric:        Mood and Affect: Mood is anxious and depressed.        Behavior: Behavior normal.        Thought Content: Thought content does not include suicidal ideation. Thought content does not include suicidal plan.         Assessment & Plan:   Problem List Items Addressed This Visit      Other   Depression, recurrent (HCC)   Relevant Medications   buPROPion (WELLBUTRIN XL) 150 MG 24 hr tablet   hydrOXYzine (ATARAX/VISTARIL) 25 MG tablet      We will try Wellbutrin and see back in 4 weeks, alcohol sensation has helped Follow up plan: Return if symptoms worsen or fail to improve.  Counseling provided for all of the vaccine components No orders of the defined types were placed in this encounter.   Arville CareJoshua Kaisyn Millea, MD [facility] 04/19/2018, 11:44 AM

## 2018-05-14 ENCOUNTER — Encounter: Payer: Self-pay | Admitting: Family Medicine

## 2018-05-14 ENCOUNTER — Ambulatory Visit (INDEPENDENT_AMBULATORY_CARE_PROVIDER_SITE_OTHER): Payer: Commercial Managed Care - PPO | Admitting: Family Medicine

## 2018-05-14 VITALS — BP 124/78 | HR 63 | Temp 97.4°F | Ht 64.0 in | Wt 156.8 lb

## 2018-05-14 DIAGNOSIS — F339 Major depressive disorder, recurrent, unspecified: Secondary | ICD-10-CM

## 2018-05-14 MED ORDER — BUPROPION HCL ER (XL) 300 MG PO TB24: 300.0000 mg | ORAL_TABLET | Freq: Every day | ORAL | 1 refills | Status: DC

## 2018-05-14 MED ORDER — HYDROXYZINE HCL 25 MG PO TABS: 25.0000 mg | ORAL_TABLET | Freq: Three times a day (TID) | ORAL | 1 refills | Status: DC | PRN

## 2018-05-14 NOTE — Progress Notes (Signed)
BP 124/78   Pulse 63   Temp (!) 97.4 F (36.3 C) (Oral)   Ht 5\' 4"  (1.626 m)   Wt 156 lb 12.8 oz (71.1 kg)   BMI 26.91 kg/m    Subjective:    Patient ID: Eric Gomez, male    DOB: 1971/04/12, 48 y.o.   MRN: [id number]  HPI: Eric Gomez is a 48 y.o. male presenting on 05/14/2018 for Depression (1 month follow up. Patient states that his depression is better now but his anxiety is worse.)   HPI Depression recheck Patient is coming in discuss depression anxiety.  He says the depression is doing a lot better on the Wellbutrin 150 and he is using the hydroxyzine for his anxiety but he says the anxiety is not as well controlled.  He denies any suicidal ideations or thoughts of hurting himself but he says that worry just has built up.  He says he will worry about little things frequently and he will have to check his phone multiple times or have to check something else multiple times because he is worried about it.  He says he knows that since not normal but he just feels like he has to do it anyways. Depression screen [facility] 2/9 05/14/2018 04/19/2018 11/17/2017 01/13/2017 10/21/2016  Decreased Interest 0 2 3 3  0  Down, Depressed, Hopeless 0 2 2 3  0  PHQ - 2 Score 0 4 5 6  0  Altered sleeping 1 2 3 3  -  Tired, decreased energy 1 2 0 3 -  Change in appetite 1 2 0 3 -  Feeling bad or failure about yourself  1 2 2 3  -  Trouble concentrating 0 0 2 3 -  Moving slowly or fidgety/restless 0 0 0 3 -  Suicidal thoughts 0 0 0 3 -  PHQ-9 Score 4 12 12 27  -  Difficult doing work/chores - - Somewhat difficult Extremely dIfficult -    Relevant past medical, surgical, family and social history reviewed and updated as indicated. Interim medical history since our last visit reviewed. Allergies and medications reviewed and updated.  Review of Systems  Constitutional: Negative for chills and fever.  Eyes: Negative for visual disturbance.  Respiratory: Negative for shortness of breath and wheezing.     Cardiovascular: Negative for chest pain and leg swelling.  Musculoskeletal: Negative for back pain and gait problem.  Skin: Negative for rash.  All other systems reviewed and are negative.   Per HPI unless specifically indicated above   Allergies as of 05/14/2018      Reactions   Bee Venom       Medication List       Accurate as of May 14, 2018  1:52 PM. Always use your most recent med list.        buPROPion 300 MG 24 hr tablet Commonly known as:  WELLBUTRIN XL Take 1 tablet (300 mg total) by mouth daily.   hydrOXYzine 25 MG tablet Commonly known as:  ATARAX/VISTARIL Take 1 tablet (25 mg total) by mouth 3 (three) times daily as needed.   valACYclovir 1000 MG tablet Commonly known as:  VALTREX Take 1 tablet (1,000 mg total) by mouth 2 (two) times daily.          Objective:    BP 124/78   Pulse 63   Temp (!) 97.4 F (36.3 C) (Oral)   Ht 5\' 4"  (1.626 m)   Wt 156 lb 12.8 oz (71.1 kg)   BMI 26.91 kg/m  Wt Readings from Last 3 Encounters:  05/14/18 156 lb 12.8 oz (71.1 kg)  04/19/18 162 lb 12.8 oz (73.8 kg)  11/17/17 148 lb (67.1 kg)    Physical Exam Vitals signs and nursing note reviewed.  Constitutional:      General: He is not in acute distress.    Appearance: He is well-developed. He is not diaphoretic.  Eyes:     General: No scleral icterus.    Conjunctiva/sclera: Conjunctivae normal.  Neck:     Musculoskeletal: Neck supple.     Thyroid: No thyromegaly.  Cardiovascular:     Rate and Rhythm: Normal rate and regular rhythm.     Heart sounds: Normal heart sounds. No murmur.  Pulmonary:     Effort: Pulmonary effort is normal. No respiratory distress.     Breath sounds: Normal breath sounds. No wheezing.  Musculoskeletal: Normal range of motion.  Lymphadenopathy:     Cervical: No cervical adenopathy.  Skin:    General: Skin is warm and dry.     Findings: No rash.  Neurological:     Mental Status: He is alert and oriented to person, place,  and time.     Coordination: Coordination normal.  Psychiatric:        Mood and Affect: Mood is anxious and depressed.        Behavior: Behavior normal.        Thought Content: Thought content does not include suicidal ideation. Thought content does not include suicidal plan.         Assessment & Plan:   Problem List Items Addressed This Visit      Other   Depression, recurrent (HCC) - Primary   Relevant Medications   hydrOXYzine (ATARAX/VISTARIL) 25 MG tablet   buPROPion (WELLBUTRIN XL) 300 MG 24 hr tablet      Will increase his Wellbutrin up to 300 mg.  See back in 4 weeks. Follow up plan: Return in about 4 weeks (around 06/11/2018), or if symptoms worsen or fail to improve.  Counseling provided for all of the vaccine components No orders of the defined types were placed in this encounter.   Arville CareJoshua Rillie Riffel, MD [facility] 05/14/2018, 1:52 PM

## 2018-05-15 ENCOUNTER — Telehealth: Payer: Self-pay | Admitting: Family Medicine

## 2018-05-15 DIAGNOSIS — F339 Major depressive disorder, recurrent, unspecified: Secondary | ICD-10-CM

## 2018-05-16 MED ORDER — BUPROPION HCL ER (XL) 300 MG PO TB24: 300.0000 mg | ORAL_TABLET | Freq: Every day | ORAL | 0 refills | Status: DC

## 2018-05-16 NOTE — Telephone Encounter (Signed)
Patient aware and verbalizes understanding. [facility] Rx re sent.

## 2018-05-16 NOTE — Telephone Encounter (Signed)
Bupropion for 90 day supply will cost pt $90 Pt know Fluoxetine has a zero cost under is insurance Please advise if this is an option, or change to something else Pt uses [facility]

## 2018-05-16 NOTE — Telephone Encounter (Signed)
The medication should not cost him this much, which pharmacy to go to, on [email or web address] bupropion 300 mg extended release for 90 tablets is $40 and that is without insurance so I do not know why it is costing him so much, if it cannot be resolved and I can always send Wellbutrin 150 mg and just have him take 2 of them but on [facility] at [facility] it is $40 Arville Care, MD [facility] 05/16/2018, 10:31 AM

## 2018-06-01 ENCOUNTER — Other Ambulatory Visit: Payer: Self-pay | Admitting: Family Medicine

## 2018-06-01 NOTE — Telephone Encounter (Signed)
Please review

## 2018-09-06 ENCOUNTER — Encounter (INDEPENDENT_AMBULATORY_CARE_PROVIDER_SITE_OTHER): Payer: Self-pay

## 2018-09-06 ENCOUNTER — Telehealth: Payer: Self-pay | Admitting: Family Medicine

## 2018-10-25 ENCOUNTER — Telehealth: Payer: Self-pay | Admitting: Family Medicine

## 2018-10-25 MED ORDER — VALACYCLOVIR HCL 1 G PO TABS: 1000.0000 mg | ORAL_TABLET | Freq: Two times a day (BID) | ORAL | 0 refills | Status: DC

## 2018-10-25 NOTE — Telephone Encounter (Signed)
Sent to prescription to requested pharmacy and left detailed message on patients voicemail

## 2018-10-29 ENCOUNTER — Ambulatory Visit: Payer: Commercial Managed Care - PPO | Admitting: Family Medicine

## 2019-01-10 ENCOUNTER — Other Ambulatory Visit: Payer: Self-pay

## 2019-01-11 ENCOUNTER — Ambulatory Visit (INDEPENDENT_AMBULATORY_CARE_PROVIDER_SITE_OTHER): Payer: Commercial Managed Care - PPO | Admitting: Family Medicine

## 2019-01-11 ENCOUNTER — Encounter: Payer: Self-pay | Admitting: Family Medicine

## 2019-01-11 ENCOUNTER — Ambulatory Visit (HOSPITAL_COMMUNITY)
Admission: RE | Admit: 2019-01-11 | Discharge: 2019-01-11 | Disposition: A | Discharge status: Home / Self Care | Payer: Commercial Managed Care - PPO | Source: Ambulatory Visit | Attending: Family Medicine | Admitting: Family Medicine

## 2019-01-11 ENCOUNTER — Other Ambulatory Visit: Payer: Self-pay

## 2019-01-11 VITALS — BP 137/91 | HR 59 | Temp 97.7°F | Ht 64.0 in | Wt 159.6 lb

## 2019-01-11 DIAGNOSIS — N492 Inflammatory disorders of scrotum: Secondary | ICD-10-CM | POA: Diagnosis not present

## 2019-01-11 NOTE — Progress Notes (Signed)
BP (!) 137/91   Pulse (!) 59   Temp 97.7 F (36.5 C) (Temporal)   Ht 5\' 4"  (1.626 m)   Wt 159 lb 9.6 oz (72.4 kg)   SpO2 99%   BMI 27.40 kg/m    Subjective:   Patient ID: Eric Gomez, male    DOB: [birth year], 48 y.o.   MRN: [id number]  HPI: Eric Gomez is a 48 y.o. male presenting on 01/11/2019 for lump on testicle (Patient states he found it a few weeks ago)   HPI Patient has noted a new right testicular nodule that is nontender and is just above his right testicle.  He noticed it first about 2 to 3 weeks ago and he says it has not changed and is not tender but is just concerned because it was new.  He denies any dysuria or urinary frequency or changes in urination or changes with intercourse erection or pain with intercourse.  Is married and has not been with any partner nurse other than his wife.  Relevant past medical, surgical, family and social history reviewed and updated as indicated. Interim medical history since our last visit reviewed. Allergies and medications reviewed and updated.  Review of Systems  Constitutional: Negative for chills and fever.  Respiratory: Negative for shortness of breath and wheezing.   Cardiovascular: Negative for chest pain and leg swelling.  Genitourinary: Negative for decreased urine volume, difficulty urinating, discharge, dysuria, flank pain, penile pain, penile swelling, scrotal swelling, testicular pain and urgency.  Musculoskeletal: Negative for back pain and gait problem.  Skin: Negative for rash.  All other systems reviewed and are negative.   Per HPI unless specifically indicated above   Allergies as of 01/11/2019      Reactions   Bee Venom       Medication List       Accurate as of January 11, 2019  9:55 AM. If you have any questions, ask your nurse or doctor.        buPROPion 300 MG 24 hr tablet Commonly known as: WELLBUTRIN XL Take 1 tablet (300 mg total) by mouth daily.   hydrOXYzine 25 MG tablet Commonly  known as: ATARAX/VISTARIL Take 1 tablet (25 mg total) by mouth 3 (three) times daily as needed.   valACYclovir 1000 MG tablet Commonly known as: VALTREX Take 1 tablet (1,000 mg total) by mouth 2 (two) times daily.        Objective:   BP (!) 137/91   Pulse (!) 59   Temp 97.7 F (36.5 C) (Temporal)   Ht 5\' 4"  (1.626 m)   Wt 159 lb 9.6 oz (72.4 kg)   SpO2 99%   BMI 27.40 kg/m   Wt Readings from Last 3 Encounters:  01/11/19 159 lb 9.6 oz (72.4 kg)  05/14/18 156 lb 12.8 oz (71.1 kg)  04/19/18 162 lb 12.8 oz (73.8 kg)    Physical Exam Vitals signs and nursing note reviewed.  Constitutional:      General: He is not in acute distress.    Appearance: He is well-developed. He is not diaphoretic.  Eyes:     General: No scleral icterus.    Conjunctiva/sclera: Conjunctivae normal.  Neck:     Thyroid: No thyromegaly.  Genitourinary:    Scrotum/Testes:        Right: Mass present. Tenderness, swelling, testicular hydrocele or varicocele not present. Right testis is descended.        Left: Mass, tenderness, swelling, testicular hydrocele or varicocele not present. Left  testis is descended.    Skin:    General: Skin is warm and dry.     Findings: No rash.  Neurological:     Mental Status: He is alert and oriented to person, place, and time.     Coordination: Coordination normal.  Psychiatric:        Behavior: Behavior normal.       Assessment & Plan:   Problem List Items Addressed This Visit    None    Visit Diagnoses    Scrotal nodule    -  Primary   Relevant Orders   US SCROTUM W/DOPPLER      Will send for scrotal ultrasound Follow up plan: Return if symptoms worsen or fail to improve.  Counseling provided for all of the vaccine components Orders Placed This Encounter  Procedures  . US SCROTUM W/DOPPLER    Caryl Pina, MD [facility] 01/11/2019, 9:55 AM

## 2019-01-14 ENCOUNTER — Telehealth: Payer: Self-pay | Admitting: Family Medicine

## 2019-11-15 ENCOUNTER — Other Ambulatory Visit: Payer: Self-pay | Admitting: Family Medicine

## 2020-02-26 ENCOUNTER — Other Ambulatory Visit: Payer: Self-pay

## 2020-02-26 ENCOUNTER — Encounter: Payer: Self-pay | Admitting: Family Medicine

## 2020-02-26 ENCOUNTER — Ambulatory Visit (INDEPENDENT_AMBULATORY_CARE_PROVIDER_SITE_OTHER): Payer: Commercial Managed Care - PPO | Admitting: Family Medicine

## 2020-02-26 VITALS — BP 135/93 | HR 61 | Temp 97.0°F | Ht 64.0 in | Wt 158.0 lb

## 2020-02-26 DIAGNOSIS — Z0001 Encounter for general adult medical examination with abnormal findings: Secondary | ICD-10-CM

## 2020-02-26 DIAGNOSIS — Z23 Encounter for immunization: Secondary | ICD-10-CM

## 2020-02-26 DIAGNOSIS — F339 Major depressive disorder, recurrent, unspecified: Secondary | ICD-10-CM

## 2020-02-26 DIAGNOSIS — Z0000 Encounter for general adult medical examination without abnormal findings: Secondary | ICD-10-CM

## 2020-02-26 MED ORDER — HYDROXYZINE HCL 25 MG PO TABS: 25.0000 mg | ORAL_TABLET | Freq: Three times a day (TID) | ORAL | 3 refills | Status: DC | PRN

## 2020-02-26 MED ORDER — VALACYCLOVIR HCL 1 G PO TABS: 1000.0000 mg | ORAL_TABLET | Freq: Two times a day (BID) | ORAL | 0 refills | Status: DC

## 2020-02-26 NOTE — Progress Notes (Signed)
BP (!) 135/93   Pulse 61   Temp (!) 97 F (36.1 C)   Ht 5' 4"  (1.626 m)   Wt 158 lb (71.7 kg)   SpO2 97%   BMI 27.12 kg/m    Subjective:   Patient ID: Eric Gomez, male    DOB: 10-May-1970, 49 y.o.   MRN: [id number]  HPI: Eric Gomez is a 49 y.o. male presenting on 02/26/2020 for Medical Management of Chronic Issues (CPE)   HPI Adult well exam and physical. Patient is coming in today for adult well exam physical exam.  He denies any major health issues or concerns.  He does still use the hydroxyzine but does use it as needed and very infrequently.  He keeps the Valtrex on hand just in case he has flares up but has been doing well with that.  Relevant past medical, surgical, family and social history reviewed and updated as indicated. Interim medical history since our last visit reviewed. Allergies and medications reviewed and updated.  Review of Systems  Constitutional: Negative for chills and fever.  HENT: Negative for ear pain and tinnitus.   Eyes: Negative for pain.  Respiratory: Negative for cough, shortness of breath and wheezing.   Cardiovascular: Negative for chest pain, palpitations and leg swelling.  Gastrointestinal: Negative for abdominal pain, blood in stool, constipation and diarrhea.  Genitourinary: Negative for dysuria and hematuria.  Musculoskeletal: Negative for back pain, gait problem and myalgias.  Skin: Negative for rash.  Neurological: Negative for dizziness, weakness, numbness and headaches.  Psychiatric/Behavioral: Negative for suicidal ideas.  All other systems reviewed and are negative.   Per HPI unless specifically indicated above   Allergies as of 02/26/2020      Reactions   Bee Venom       Medication List       Accurate as of February 26, 2020 10:31 AM. If you have any questions, ask your nurse or doctor.        STOP taking these medications   buPROPion 300 MG 24 hr tablet Commonly known as: WELLBUTRIN XL Stopped by: Fransisca Kaufmann  Roshawna Colclasure, MD     TAKE these medications   hydrOXYzine 25 MG tablet Commonly known as: ATARAX/VISTARIL Take 1 tablet (25 mg total) by mouth 3 (three) times daily as needed.   valACYclovir 1000 MG tablet Commonly known as: VALTREX Take 1 tablet (1,000 mg total) by mouth 2 (two) times daily.        Objective:   BP (!) 135/93   Pulse 61   Temp (!) 97 F (36.1 C)   Ht 5' 4"  (1.626 m)   Wt 158 lb (71.7 kg)   SpO2 97%   BMI 27.12 kg/m   Wt Readings from Last 3 Encounters:  02/26/20 158 lb (71.7 kg)  01/11/19 159 lb 9.6 oz (72.4 kg)  05/14/18 156 lb 12.8 oz (71.1 kg)    Physical Exam Vitals and nursing note reviewed.  Constitutional:      General: He is not in acute distress.    Appearance: He is well-developed. He is not diaphoretic.  Eyes:     General: No scleral icterus.    Conjunctiva/sclera: Conjunctivae normal.  Cardiovascular:     Rate and Rhythm: Normal rate and regular rhythm.     Heart sounds: Normal heart sounds. No murmur heard.   Pulmonary:     Effort: Pulmonary effort is normal. No respiratory distress.     Breath sounds: Normal breath sounds. No wheezing.  Musculoskeletal:  General: Normal range of motion.  Skin:    General: Skin is warm and dry.     Findings: No rash.  Neurological:     Mental Status: He is alert and oriented to person, place, and time.     Coordination: Coordination normal.  Psychiatric:        Behavior: Behavior normal.       Assessment & Plan:   Problem List Items Addressed This Visit      Other   Depression, recurrent ([place])   Relevant Medications   hydrOXYzine (ATARAX/VISTARIL) 25 MG tablet   Other Relevant Orders   CBC with Differential/Platelet   TSH    Other Visit Diagnoses    Well adult exam    -  Primary   Relevant Orders   CBC with Differential/Platelet   CMP14+EGFR   Lipid panel   TSH   Need for immunization against influenza       Relevant Orders   Flu Vaccine QUAD 36+ mos IM (Completed)       Will check blood work and continue current medication, seems to be doing well. Follow up plan: Return in about 1 year (around 02/25/2021), or if symptoms worsen or fail to improve.  Counseling provided for all of the vaccine components Orders Placed This Encounter  Procedures  . Flu Vaccine QUAD 36+ mos IM  . CBC with Differential/Platelet  . CMP14+EGFR  . Lipid panel  . TSH    Caryl Pina, MD [facility] 02/26/2020, 10:31 AM

## 2020-02-27 LAB — TSH: TSH: 1.51 u[IU]/mL (ref 0.450–4.500)

## 2020-02-27 LAB — LIPID PANEL
Chol/HDL Ratio: 4.7 ratio (ref 0.0–5.0)
Cholesterol, Total: 218 mg/dL — ABNORMAL HIGH (ref 100–199)
HDL: 46 mg/dL (ref >39)
LDL Chol Calc (NIH): 147 mg/dL — ABNORMAL HIGH (ref 0–99)
Triglycerides: 138 mg/dL (ref 0–149)
VLDL Cholesterol Cal: 25 mg/dL (ref 5–40)

## 2020-02-27 LAB — CBC WITH DIFFERENTIAL/PLATELET
Basophils Absolute: 0.1 10*3/uL (ref 0.0–0.2)
Basos: 1 %
EOS (ABSOLUTE): 0.3 10*3/uL (ref 0.0–0.4)
Eos: 5 %
Hematocrit: 44.8 % (ref 37.5–51.0)
Hemoglobin: 15.5 g/dL (ref 13.0–17.7)
Immature Grans (Abs): 0 10*3/uL (ref 0.0–0.1)
Immature Granulocytes: 0 %
Lymphocytes Absolute: 1.7 10*3/uL (ref 0.7–3.1)
Lymphs: 31 %
MCH: 30.5 pg (ref 26.6–33.0)
MCHC: 34.6 g/dL (ref 31.5–35.7)
MCV: 88 fL (ref 79–97)
Monocytes Absolute: 0.5 10*3/uL (ref 0.1–0.9)
Monocytes: 9 %
Neutrophils Absolute: 3 10*3/uL (ref 1.4–7.0)
Neutrophils: 54 %
Platelets: 250 10*3/uL (ref 150–450)
RBC: 5.08 x10E6/uL (ref 4.14–5.80)
RDW: 12.8 % (ref 11.6–15.4)
WBC: 5.5 10*3/uL (ref 3.4–10.8)

## 2020-02-27 LAB — CMP14+EGFR
ALT: 58 IU/L — ABNORMAL HIGH (ref 0–44)
AST: 40 IU/L (ref 0–40)
Albumin/Globulin Ratio: 2.5 — ABNORMAL HIGH (ref 1.2–2.2)
Albumin: 4.7 g/dL (ref 4.0–5.0)
Alkaline Phosphatase: 63 IU/L (ref 44–121)
BUN/Creatinine Ratio: 14 (ref 9–20)
BUN: 12 mg/dL (ref 6–24)
Bilirubin Total: 0.4 mg/dL (ref 0.0–1.2)
CO2: 25 mmol/L (ref 20–29)
Calcium: 9.7 mg/dL (ref 8.7–10.2)
Chloride: 100 mmol/L (ref 96–106)
Creatinine, Ser: 0.87 mg/dL (ref 0.76–1.27)
GFR calc Af Amer: 118 mL/min/{1.73_m2} (ref >59)
GFR calc non Af Amer: 102 mL/min/{1.73_m2} (ref >59)
Globulin, Total: 1.9 g/dL (ref 1.5–4.5)
Glucose: 91 mg/dL (ref 65–99)
Potassium: 4.8 mmol/L (ref 3.5–5.2)
Sodium: 137 mmol/L (ref 134–144)
Total Protein: 6.6 g/dL (ref 6.0–8.5)

## 2020-07-16 ENCOUNTER — Other Ambulatory Visit: Payer: Self-pay | Admitting: Family Medicine

## 2020-07-16 NOTE — Telephone Encounter (Signed)
OV 02/25/21 rtc 1 yr

## 2020-07-30 ENCOUNTER — Telehealth: Payer: Self-pay | Admitting: Family Medicine

## 2020-07-30 DIAGNOSIS — Z1211 Encounter for screening for malignant neoplasm of colon: Secondary | ICD-10-CM

## 2020-07-30 NOTE — Telephone Encounter (Signed)
Pt is ok with [place] location. Referral placed to [facility]. Pt knows to listen for a call to schedule.

## 2020-07-30 NOTE — Telephone Encounter (Signed)
Pt would like to schedule a colonoscopy

## 2020-08-06 ENCOUNTER — Encounter: Payer: Self-pay | Admitting: Gastroenterology

## 2020-09-10 ENCOUNTER — Ambulatory Visit (AMBULATORY_SURGERY_CENTER): Payer: Commercial Managed Care - PPO

## 2020-09-10 ENCOUNTER — Other Ambulatory Visit: Payer: Self-pay

## 2020-09-10 VITALS — Ht 64.0 in | Wt 135.0 lb

## 2020-09-10 DIAGNOSIS — Z1211 Encounter for screening for malignant neoplasm of colon: Secondary | ICD-10-CM

## 2020-09-10 NOTE — Progress Notes (Signed)
Pre visit completed via phone call; Patient verified name, DOB, and address; No egg or soy allergy known to patient  No issues with past sedation with any surgeries or procedures Patient denies ever being told they had issues or difficulty with intubation  No FH of Malignant Hyperthermia No diet pills per patient No home 02 use per patient  No blood thinners per patient  Pt denies issues with constipation  No A fib or A flutter  EMMI video via MyChart  COVID 19 guidelines implemented in [facility] today with Pt and RN   Discussed with pt there will be an out-of-pocket cost for prep and that varies from $0 to 70 dollars   Due to the COVID-19 pandemic we are asking patients to follow certain guidelines.  Pt aware of COVID protocols and [facility] guidelines   

## 2020-09-19 IMAGING — US US SCROTUM W/ DOPPLER COMPLETE
1 series · 13 of 25 positions shown · non-contrast
Comparison: None.

CLINICAL DATA: Right supra testicular nodule for 2 weeks, history
of prior vasectomy

EXAM:
SCROTAL ULTRASOUND
DOPPLER ULTRASOUND OF THE TESTICLES
TECHNIQUE: Complete ultrasound examination of the testicles, epididymis, and
other scrotal structures was performed. Color and spectral Doppler
ultrasound were also utilized to evaluate blood flow to the
testicles.

[Series 1: us scrotum w/ doppler complete · 13 of 79 slices shown]
[im 1/79]
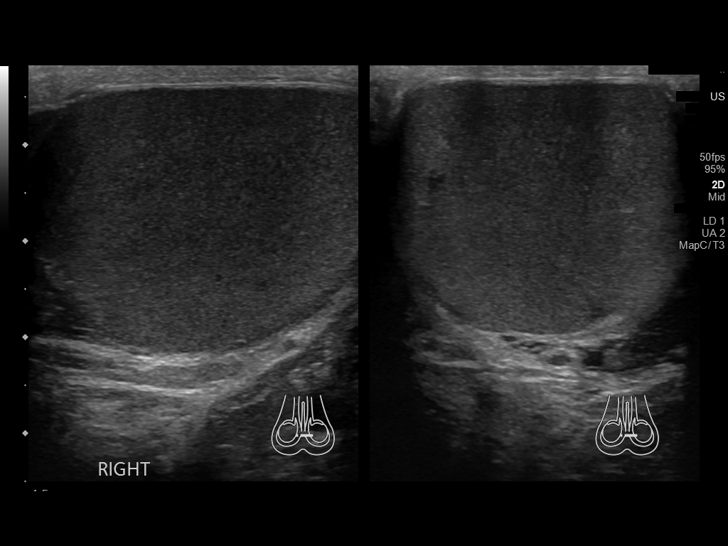
[im 7/79]
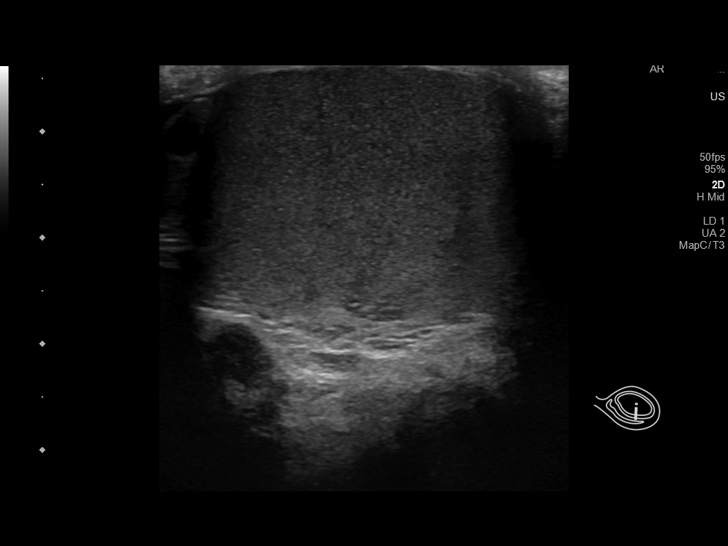
[im 14/79]
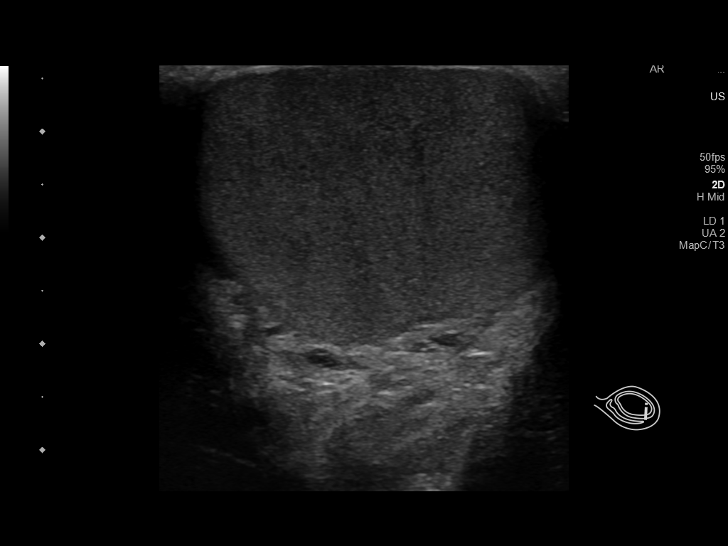
[im 20/79]
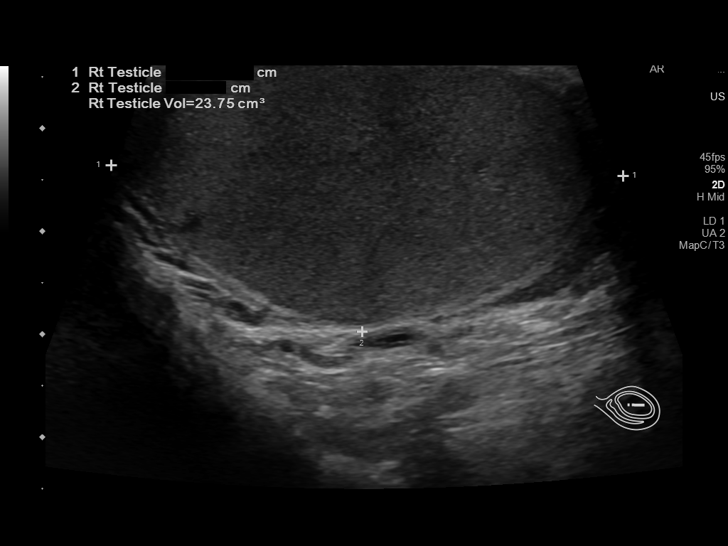
[im 27/79]
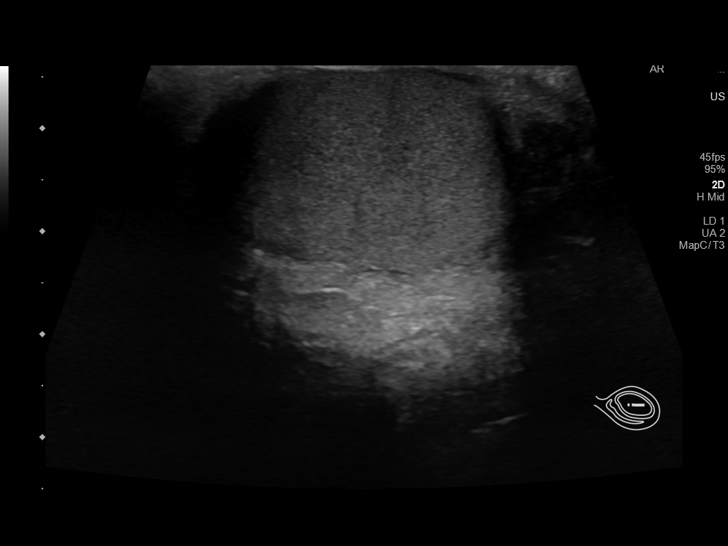
[im 33/79]
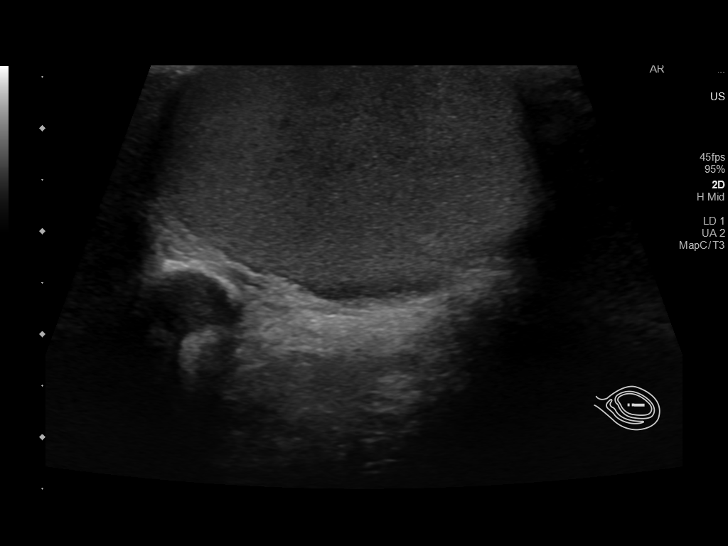
[im 40/79]
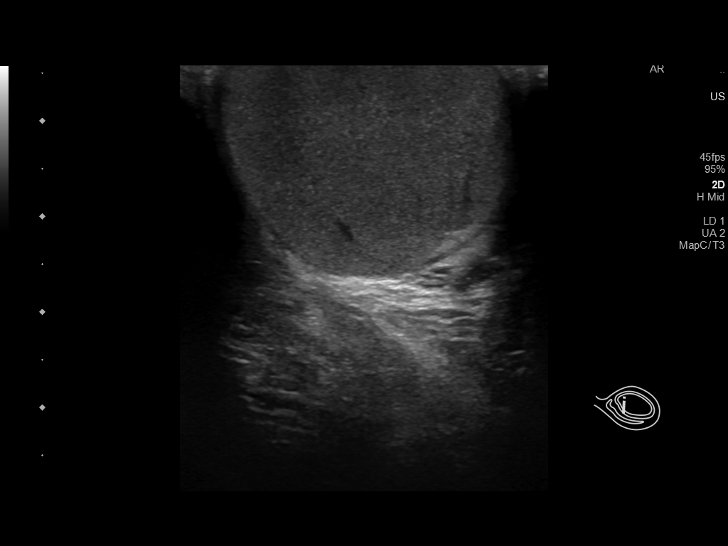
[im 46/79]
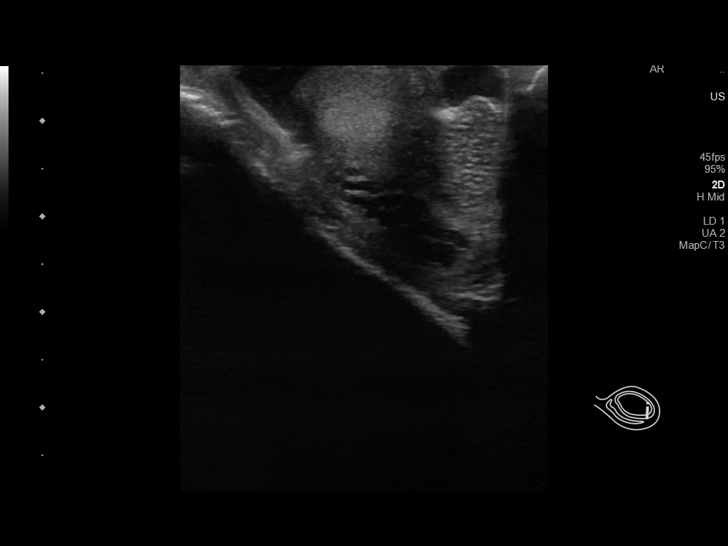
[im 53/79]
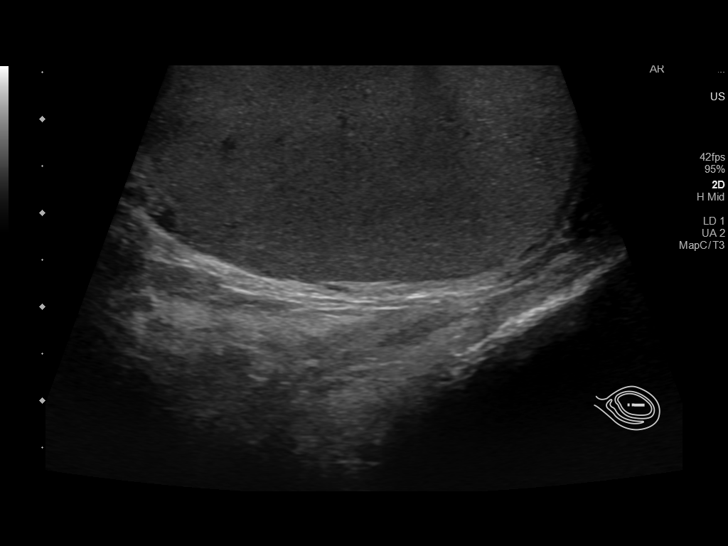
[im 59/79]
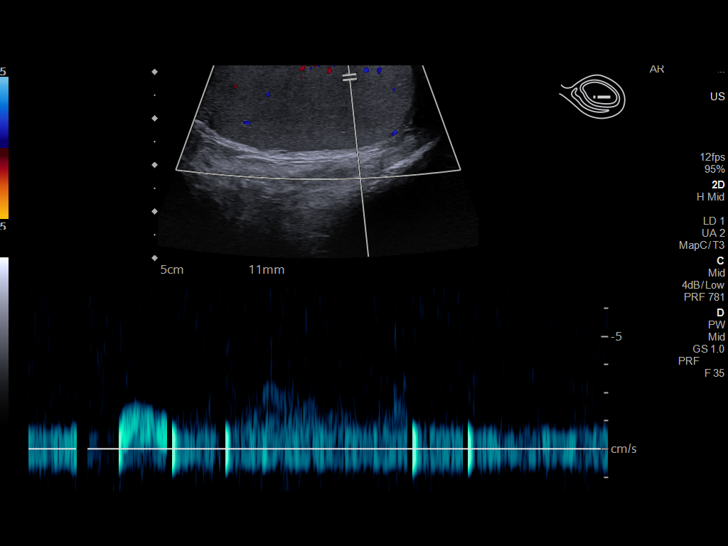
[im 66/79]
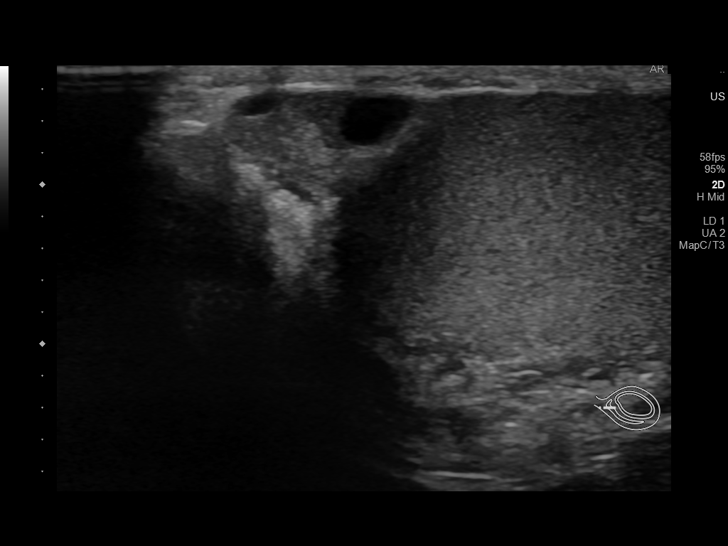
[im 72/79]
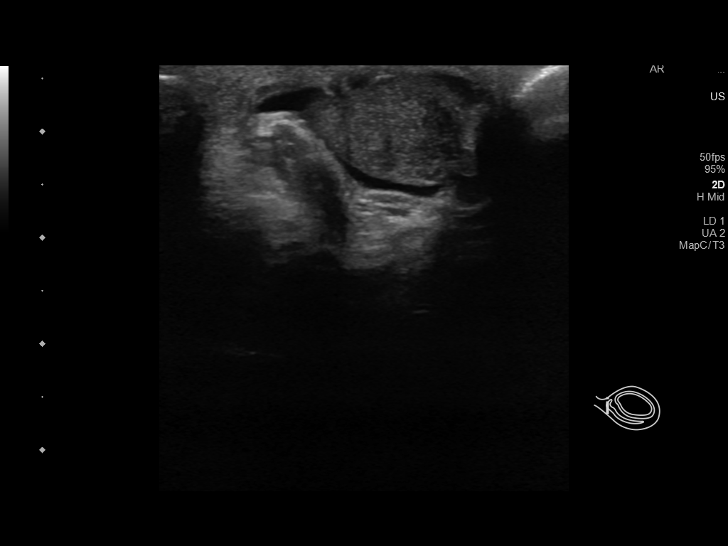
[im 79/79]
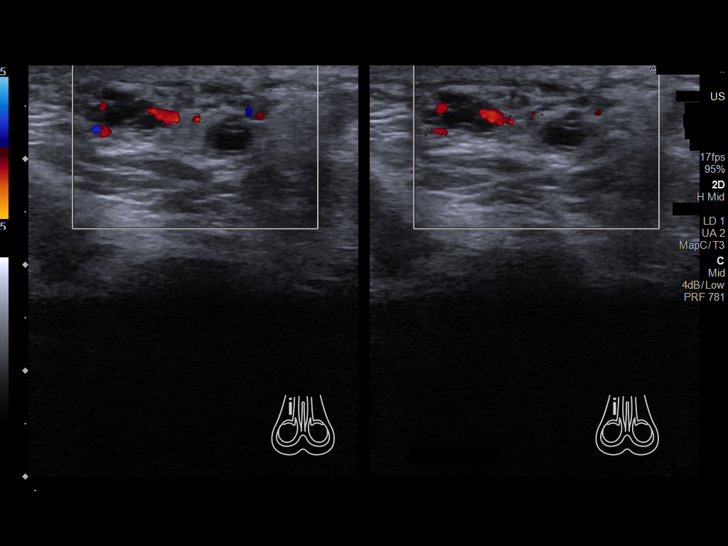

[13 of 25 positions shown; findings below may reference images not displayed]

FINDINGS: Right testicle

Measurements: 5.0 x 2.7 x 3.4 cm. No mass lesion is noted.
Calcifications are noted along the right testicle likely related to
scrotal pearls or sperm granulomas.

Left testicle

Measurements: 4.9 x 2.9 x 3.4 cm. No mass or microlithiasis
visualized.

Right epididymis: Cystic changes are noted in the right epididymis.
The largest of these measures 5 mm. This may correspond with the
supratesticular nodule.

Left epididymis:  Normal in size and appearance.

Hydrocele:  Small bilateral hydroceles are noted.

Varicocele:  None visualized.

Pulsed Doppler interrogation of both testes demonstrates normal low
resistance arterial and venous waveforms bilaterally.
IMPRESSION: Epididymal cysts on the right likely corresponding to the palpable
abnormality.

Normal-appearing testicles with the exception of calcifications
along the right testis likely related to scrotal pearl or sperm
granuloma.

## 2020-09-24 ENCOUNTER — Encounter: Payer: Self-pay | Admitting: Gastroenterology

## 2020-09-29 ENCOUNTER — Telehealth: Payer: Self-pay | Admitting: Gastroenterology

## 2020-09-29 NOTE — Telephone Encounter (Signed)
Hello Dr. Barron Alvine,   Inbound call from patient. Had to cancel procedure 10/01/20 due to wife having CT at the same time. I have rescheduled procedure for 10/09/20.

## 2020-09-30 ENCOUNTER — Ambulatory Visit (INDEPENDENT_AMBULATORY_CARE_PROVIDER_SITE_OTHER): Payer: Commercial Managed Care - PPO | Admitting: Family Medicine

## 2020-09-30 ENCOUNTER — Other Ambulatory Visit: Payer: Self-pay

## 2020-09-30 ENCOUNTER — Encounter: Payer: Self-pay | Admitting: Family Medicine

## 2020-09-30 VITALS — BP 111/78 | HR 69 | Temp 98.6°F | Ht 64.0 in | Wt 137.4 lb

## 2020-09-30 DIAGNOSIS — Z0000 Encounter for general adult medical examination without abnormal findings: Secondary | ICD-10-CM

## 2020-09-30 NOTE — Progress Notes (Signed)
 BP 111/78   Pulse 69   Temp 98.6 F (37 C)   Ht 5' 4" (1.626 m)   Wt 137 lb 6.4 oz (62.3 kg)   SpO2 96%   BMI 23.58 kg/m    Subjective:   Patient ID: Eric Gomez, male    DOB: 06/24/1970, 49 y.o.   MRN: [id number]  HPI: Eric Gomez is a 49 y.o. male presenting on 09/30/2020 for Annual Exam   HPI Adult well exam Patient denies any chest pain, shortness of breath, headaches or vision issues, abdominal complaints, diarrhea, nausea, vomiting, or joint issues.  He says his anxiety medicine is doing well and only uses it very infrequently.  He is very happy about work and trying to make healthy changes to be more active and be more healthy and is feeling good about that.  Relevant past medical, surgical, family and social history reviewed and updated as indicated. Interim medical history since our last visit reviewed. Allergies and medications reviewed and updated.  Review of Systems  Constitutional: Negative for chills and fever.  HENT: Negative for ear pain and tinnitus.   Eyes: Negative for pain.  Respiratory: Negative for cough, shortness of breath and wheezing.   Cardiovascular: Negative for chest pain, palpitations and leg swelling.  Gastrointestinal: Negative for abdominal pain, blood in stool, constipation and diarrhea.  Genitourinary: Negative for dysuria and hematuria.  Musculoskeletal: Negative for back pain, gait problem and myalgias.  Skin: Negative for rash.  Neurological: Negative for dizziness, weakness and headaches.  Psychiatric/Behavioral: Negative for suicidal ideas.  All other systems reviewed and are negative.   Per HPI unless specifically indicated above   Allergies as of 09/30/2020      Reactions   Bee Venom       Medication List       Accurate as of September 30, 2020  2:39 PM. If you have any questions, ask your nurse or doctor.        hydrOXYzine 25 MG tablet Commonly known as: ATARAX/VISTARIL Take 1 tablet (25 mg total) by mouth 3 (three)  times daily as needed.   valACYclovir 1000 MG tablet Commonly known as: VALTREX TAKE 1 TABLET BY MOUTH TWICE A DAY What changed:   when to take this  reasons to take this        Objective:   BP 111/78   Pulse 69   Temp 98.6 F (37 C)   Ht 5' 4" (1.626 m)   Wt 137 lb 6.4 oz (62.3 kg)   SpO2 96%   BMI 23.58 kg/m   Wt Readings from Last 3 Encounters:  09/30/20 137 lb 6.4 oz (62.3 kg)  09/10/20 135 lb (61.2 kg)  02/26/20 158 lb (71.7 kg)    Physical Exam Vitals and nursing note reviewed.  Constitutional:      General: He is not in acute distress.    Appearance: He is well-developed. He is not diaphoretic.  HENT:     Right Ear: External ear normal.     Left Ear: External ear normal.     Nose: Nose normal.     Mouth/Throat:     Pharynx: No oropharyngeal exudate.  Eyes:     General: No scleral icterus.    Conjunctiva/sclera: Conjunctivae normal.     Pupils: Pupils are equal, round, and reactive to light.  Neck:     Thyroid: No thyromegaly.  Cardiovascular:     Rate and Rhythm: Normal rate and regular rhythm.     Heart   sounds: Normal heart sounds. No murmur heard.   Pulmonary:     Effort: Pulmonary effort is normal. No respiratory distress.     Breath sounds: Normal breath sounds. No wheezing.  Abdominal:     General: Bowel sounds are normal. There is no distension.     Palpations: Abdomen is soft.     Tenderness: There is no abdominal tenderness. There is no guarding or rebound.  Genitourinary:    Comments: Patient declined exam says he does not have any issues Musculoskeletal:        General: Normal range of motion.     Cervical back: Neck supple.  Lymphadenopathy:     Cervical: No cervical adenopathy.  Skin:    General: Skin is warm and dry.     Findings: No rash.  Neurological:     Mental Status: He is alert and oriented to person, place, and time.     Coordination: Coordination normal.  Psychiatric:        Behavior: Behavior normal.        Assessment & Plan:   Problem List Items Addressed This Visit   None   Visit Diagnoses    Physical exam, annual    -  Primary   Relevant Orders   CBC with Differential/Platelet   CMP14+EGFR   Lipid panel    Patient has the occasional anxiety and takes the occasional hydroxyzine in the occasional cold sore Claritin takes occasional Valtrex and will continue forward with those but he says he does not need any changes and only needs those as needed.  He has colonoscopy scheduled for 2 weeks from now  Follow up plan: Return in about 1 year (around 09/30/2021), or if symptoms worsen or fail to improve, for Physical exam.  Counseling provided for all of the vaccine components No orders of the defined types were placed in this encounter.   Joshua Dettinger, MD [facility] 09/30/2020, 2:39 PM     

## 2020-10-01 ENCOUNTER — Encounter: Payer: Commercial Managed Care - PPO | Admitting: Gastroenterology

## 2020-10-01 LAB — CBC WITH DIFFERENTIAL/PLATELET
Basophils Absolute: 0.1 10*3/uL (ref 0.0–0.2)
Basos: 1 %
EOS (ABSOLUTE): 0.3 10*3/uL (ref 0.0–0.4)
Eos: 4 %
Hematocrit: 41.1 % (ref 37.5–51.0)
Hemoglobin: 14 g/dL (ref 13.0–17.7)
Immature Grans (Abs): 0 10*3/uL (ref 0.0–0.1)
Immature Granulocytes: 0 %
Lymphocytes Absolute: 1.8 10*3/uL (ref 0.7–3.1)
Lymphs: 27 %
MCH: 28.7 pg (ref 26.6–33.0)
MCHC: 34.1 g/dL (ref 31.5–35.7)
MCV: 84 fL (ref 79–97)
Monocytes Absolute: 0.4 10*3/uL (ref 0.1–0.9)
Monocytes: 6 %
Neutrophils Absolute: 4.1 10*3/uL (ref 1.4–7.0)
Neutrophils: 62 %
Platelets: 252 10*3/uL (ref 150–450)
RBC: 4.88 x10E6/uL (ref 4.14–5.80)
RDW: 12.8 % (ref 11.6–15.4)
WBC: 6.7 10*3/uL (ref 3.4–10.8)

## 2020-10-01 LAB — LIPID PANEL
Chol/HDL Ratio: 3.5 ratio (ref 0.0–5.0)
Cholesterol, Total: 189 mg/dL (ref 100–199)
HDL: 54 mg/dL (ref >39)
LDL Chol Calc (NIH): 127 mg/dL — ABNORMAL HIGH (ref 0–99)
Triglycerides: 39 mg/dL (ref 0–149)
VLDL Cholesterol Cal: 8 mg/dL (ref 5–40)

## 2020-10-01 LAB — CMP14+EGFR
ALT: 22 IU/L (ref 0–44)
AST: 21 IU/L (ref 0–40)
Albumin/Globulin Ratio: 3.1 — ABNORMAL HIGH (ref 1.2–2.2)
Albumin: 4.6 g/dL (ref 4.0–5.0)
Alkaline Phosphatase: 73 IU/L (ref 44–121)
BUN/Creatinine Ratio: 13 (ref 9–20)
BUN: 11 mg/dL (ref 6–24)
Bilirubin Total: 0.8 mg/dL (ref 0.0–1.2)
CO2: 23 mmol/L (ref 20–29)
Calcium: 9.5 mg/dL (ref 8.7–10.2)
Chloride: 101 mmol/L (ref 96–106)
Creatinine, Ser: 0.85 mg/dL (ref 0.76–1.27)
Globulin, Total: 1.5 g/dL (ref 1.5–4.5)
Glucose: 85 mg/dL (ref 65–99)
Potassium: 4.6 mmol/L (ref 3.5–5.2)
Sodium: 139 mmol/L (ref 134–144)
Total Protein: 6.1 g/dL (ref 6.0–8.5)
eGFR: 107 mL/min/{1.73_m2} (ref >59)

## 2020-10-02 ENCOUNTER — Encounter: Payer: Commercial Managed Care - PPO | Admitting: Gastroenterology

## 2020-10-05 NOTE — Telephone Encounter (Signed)
Hey Dr. Barron Alvine,   Inbound call from patient. Had to cancel procedure 10/09/2020 due to wife going out of town. Rescheduled for 11/17/20

## 2020-10-06 NOTE — Telephone Encounter (Signed)
Thank you for the update!

## 2020-10-09 ENCOUNTER — Encounter: Payer: Commercial Managed Care - PPO | Admitting: Gastroenterology

## 2020-11-12 NOTE — Telephone Encounter (Signed)
Good afternoon Dr. Barron Alvine, patient called stating has a family emergency that he needs to be out of town for so procedure has been canceled.  He will call back at a later date to reschedule.

## 2020-11-17 ENCOUNTER — Encounter: Payer: Commercial Managed Care - PPO | Admitting: Gastroenterology

## 2021-04-22 ENCOUNTER — Other Ambulatory Visit: Payer: Self-pay | Admitting: Family Medicine

## 2021-04-22 DIAGNOSIS — F339 Major depressive disorder, recurrent, unspecified: Secondary | ICD-10-CM

## 2021-04-22 NOTE — Telephone Encounter (Signed)
Last office visit 09/30/20 Last refill 02/26/20, #30, 3 refills

## 2021-12-20 ENCOUNTER — Ambulatory Visit (INDEPENDENT_AMBULATORY_CARE_PROVIDER_SITE_OTHER): Payer: 59 | Admitting: Family Medicine

## 2021-12-20 ENCOUNTER — Encounter: Payer: Self-pay | Admitting: Family Medicine

## 2021-12-20 DIAGNOSIS — J0110 Acute frontal sinusitis, unspecified: Secondary | ICD-10-CM

## 2021-12-20 MED ORDER — AMOXICILLIN 500 MG PO CAPS: 500.0000 mg | ORAL_CAPSULE | Freq: Two times a day (BID) | ORAL | 0 refills | Status: DC

## 2021-12-20 NOTE — Progress Notes (Signed)
Virtual Visit via telephone Note  I connected with Eric Gomez on 12/20/21 at 1508 by telephone and verified that I am speaking with the correct person using two identifiers. Eric Gomez is currently located at home and patient are currently with her during visit. The provider, Elige Radon Alaynna Kerwood, MD is located in their office at time of visit.  Call ended at 1513  I discussed the limitations, risks, security and privacy concerns of performing an evaluation and management service by telephone and the availability of in person appointments. I also discussed with the patient that there may be a patient responsible charge related to this service. The patient expressed understanding and agreed to proceed.   History and Present Illness: Patient is calling in for sinus pressure and drainage and sore throat and headache.  He has a lot of sinus pressure.  He did a covid test and negative. He denies fevers or chills or SOB or wheezing.  It started yesterday and he has a lot more pressure.  He denies sick contacts. The headache came on today as well. He started using allergy nasal spray and it is not helping because today he has gotten a lot worse.   1. Acute non-recurrent frontal sinusitis     Outpatient Encounter Medications as of 12/20/2021  Medication Sig   amoxicillin (AMOXIL) 500 MG capsule Take 1 capsule (500 mg total) by mouth 2 (two) times daily.   hydrOXYzine (ATARAX) 25 MG tablet TAKE 1 TABLET BY MOUTH THREE TIMES A DAY AS NEEDED   valACYclovir (VALTREX) 1000 MG tablet TAKE 1 TABLET BY MOUTH TWICE A DAY (Patient taking differently: Take 1,000 mg by mouth 2 (two) times daily as needed.)   No facility-administered encounter medications on file as of 12/20/2021.    Review of Systems  Constitutional:  Negative for chills and fever.  HENT:  Positive for congestion, postnasal drip, rhinorrhea, sinus pressure, sneezing and sore throat. Negative for ear discharge, ear pain and voice change.    Eyes:  Negative for visual disturbance.  Respiratory:  Positive for cough (little cough in the am). Negative for shortness of breath and wheezing.   Cardiovascular:  Negative for chest pain and leg swelling.  Musculoskeletal:  Negative for gait problem.  Skin:  Negative for rash.  All other systems reviewed and are negative.   Observations/Objective: Patient sounds comfortable and in no acute distress  Assessment and Plan: Problem List Items Addressed This Visit   None Visit Diagnoses     Acute non-recurrent frontal sinusitis    -  Primary   Relevant Medications   amoxicillin (AMOXIL) 500 MG capsule       Sounds like sinus infection, worsening, will go ahead and treat medication. Follow up plan: Return if symptoms worsen or fail to improve.     I discussed the assessment and treatment plan with the patient. The patient was provided an opportunity to ask questions and all were answered. The patient agreed with the plan and demonstrated an understanding of the instructions.   The patient was advised to call back or seek an in-person evaluation if the symptoms worsen or if the condition fails to improve as anticipated.  The above assessment and management plan was discussed with the patient. The patient verbalized understanding of and has agreed to the management plan. Patient is aware to call the clinic if symptoms persist or worsen. Patient is aware when to return to the clinic for a follow-up visit. Patient educated on when it is  appropriate to go to the emergency department.    I provided 5 minutes of non-face-to-face time during this encounter.    Nils Pyle, MD

## 2022-01-31 ENCOUNTER — Ambulatory Visit: Payer: 59 | Admitting: Family Medicine

## 2022-03-11 ENCOUNTER — Other Ambulatory Visit: Payer: Self-pay | Admitting: Family Medicine

## 2022-03-11 DIAGNOSIS — F339 Major depressive disorder, recurrent, unspecified: Secondary | ICD-10-CM

## 2022-03-19 ENCOUNTER — Other Ambulatory Visit: Payer: Self-pay | Admitting: Family Medicine

## 2022-04-21 ENCOUNTER — Ambulatory Visit (INDEPENDENT_AMBULATORY_CARE_PROVIDER_SITE_OTHER): Payer: 59 | Admitting: Family Medicine

## 2022-04-21 ENCOUNTER — Encounter: Payer: Self-pay | Admitting: Family Medicine

## 2022-04-21 DIAGNOSIS — B001 Herpesviral vesicular dermatitis: Secondary | ICD-10-CM

## 2022-04-21 DIAGNOSIS — F339 Major depressive disorder, recurrent, unspecified: Secondary | ICD-10-CM

## 2022-04-21 MED ORDER — VALACYCLOVIR HCL 1 G PO TABS: 1000.0000 mg | ORAL_TABLET | Freq: Two times a day (BID) | ORAL | 2 refills | Status: DC

## 2022-04-21 MED ORDER — HYDROXYZINE HCL 25 MG PO TABS: 25.0000 mg | ORAL_TABLET | Freq: Three times a day (TID) | ORAL | 3 refills | Status: DC | PRN

## 2022-04-21 NOTE — Progress Notes (Signed)
Virtual Visit via telephone Note  I connected with Eric Gomez on 04/21/22 at 1527 by telephone and verified that I am speaking with the correct person using two identifiers. Eric Gomez is currently located at home and patient are currently with her during visit. The provider, Elige Radon Aleina Burgio, MD is located in their office at time of visit.  Call ended at 1533  I discussed the limitations, risks, security and privacy concerns of performing an evaluation and management service by telephone and the availability of in person appointments. I also discussed with the patient that there may be a patient responsible charge related to this service. The patient expressed understanding and agreed to proceed.   History and Present Illness: Anxiety and depression Patient is doing ok with hydroxyzine as needed and it is working well.  She denies major anxiety and does well. He denies suicidal ideations or thoughts of hurting self.  He feels like it does well with just the occasional use.  Patient is coming in for refills on Valtrex as well.  He gets cold sores occasionally but has not had them too often and just likes to keep it on hand just in case when he gets it.  He maybe gets a few flareups a year but nothing major.  1. Depression, recurrent ([facility])   2. Recurrent cold sores     Outpatient Encounter Medications as of 04/21/2022  Medication Sig   hydrOXYzine (ATARAX) 25 MG tablet Take 1 tablet (25 mg total) by mouth 3 (three) times daily as needed for anxiety.   valACYclovir (VALTREX) 1000 MG tablet Take 1 tablet (1,000 mg total) by mouth 2 (two) times daily.   [DISCONTINUED] amoxicillin (AMOXIL) 500 MG capsule Take 1 capsule (500 mg total) by mouth 2 (two) times daily.   [DISCONTINUED] hydrOXYzine (ATARAX) 25 MG tablet TAKE 1 TABLET BY MOUTH THREE TIMES A DAY AS NEEDED   [DISCONTINUED] valACYclovir (VALTREX) 1000 MG tablet TAKE 1 TABLET BY MOUTH TWICE A DAY (Patient taking differently: Take  1,000 mg by mouth 2 (two) times daily as needed.)   No facility-administered encounter medications on file as of 04/21/2022.    Review of Systems  Constitutional:  Negative for chills and fever.  Eyes:  Negative for discharge.  Respiratory:  Negative for shortness of breath and wheezing.   Cardiovascular:  Negative for chest pain and leg swelling.  Musculoskeletal:  Negative for back pain and gait problem.  Skin:  Negative for rash.  Psychiatric/Behavioral:  Positive for dysphoric mood and sleep disturbance. Negative for self-injury and suicidal ideas. The patient is nervous/anxious.   All other systems reviewed and are negative.   Observations/Objective: Patient sounds comfortable and in no acute distress  Assessment and Plan: Problem List Items Addressed This Visit       Other   Depression, recurrent ([facility]) - Primary   Relevant Medications   hydrOXYzine (ATARAX) 25 MG tablet   Other Visit Diagnoses     Recurrent cold sores       Relevant Medications   valACYclovir (VALTREX) 1000 MG tablet       Refill hydroxyzine and Valtrex and follow-up with his physical in January or February.  He says he already has the appointment. Follow up plan: Return if symptoms worsen or fail to improve, for Physical exam in January or February.     I discussed the assessment and treatment plan with the patient. The patient was provided an opportunity to ask questions and all were answered. The patient  agreed with the plan and demonstrated an understanding of the instructions.   The patient was advised to call back or seek an in-person evaluation if the symptoms worsen or if the condition fails to improve as anticipated.  The above assessment and management plan was discussed with the patient. The patient verbalized understanding of and has agreed to the management plan. Patient is aware to call the clinic if symptoms persist or worsen. Patient is aware when to return to the clinic for a  follow-up visit. Patient educated on when it is appropriate to go to the emergency department.    I provided 6 minutes of non-face-to-face time during this encounter.    Nils Pyle, MD

## 2022-07-27 ENCOUNTER — Ambulatory Visit (INDEPENDENT_AMBULATORY_CARE_PROVIDER_SITE_OTHER): Payer: 59 | Admitting: Family Medicine

## 2022-07-27 ENCOUNTER — Encounter: Payer: Self-pay | Admitting: Family Medicine

## 2022-07-27 VITALS — BP 123/85 | HR 77 | Temp 98.3°F | Ht 64.0 in | Wt 176.2 lb

## 2022-07-27 DIAGNOSIS — L219 Seborrheic dermatitis, unspecified: Secondary | ICD-10-CM

## 2022-07-27 MED ORDER — KETOCONAZOLE 2 % EX SHAM
1.0000 | MEDICATED_SHAMPOO | CUTANEOUS | 0 refills | Status: DC
Start: 2022-07-28 — End: 2022-11-07

## 2022-07-27 NOTE — Patient Instructions (Signed)
Shampoo: Apply 5 to 10 mL to wet scalp, lather, leave on 3 to 5 minutes, and rinse; apply twice weekly for 2 to 4 weeks. Return if symptoms do not improve in 2-4 weeks

## 2022-07-27 NOTE — Progress Notes (Signed)
Acute Office Visit  Subjective:  Patient ID: Eric Gomez, male    DOB: [birth year], 52 y.o.   MRN: [id number]  Chief Complaint  Patient presents with   Acute Visit    Red patches on scalp    Rash This is a new problem. The current episode started more than 1 month ago. The problem has been gradually worsening since onset. The affected locations include the scalp. The rash is characterized by redness, scaling, peeling and itchiness. He was exposed to nothing. Pertinent negatives include no anorexia, congestion, cough, diarrhea, eye pain, facial edema, fatigue, fever, joint pain, nail changes, rhinorrhea, shortness of breath, sore throat or vomiting. Past treatments include moisturizer (new shampoo after symptoms started. Believed it to be dandruff). The treatment provided no relief. His past medical history is significant for allergies. There is no history of asthma.  States that he works with [employer] and wears hard hat, sweats often in hat.  Has been trying head and shoulders dandruff shampoo.   Review of Systems  Constitutional:  Negative for fatigue and fever.  HENT:  Negative for congestion, rhinorrhea and sore throat.   Eyes:  Negative for pain.  Respiratory:  Negative for cough and shortness of breath.   Gastrointestinal:  Negative for anorexia, diarrhea and vomiting.  Musculoskeletal:  Negative for joint pain.  Skin:  Positive for rash. Negative for nail changes.      Objective:  BP 123/85   Pulse 77   Temp 98.3 F (36.8 C)   Ht 5\' 4"  (1.626 m)   Wt 176 lb 3.2 oz (79.9 kg)   SpO2 96%   BMI 30.24 kg/m    Physical Exam Constitutional:      General: He is not in acute distress.    Appearance: Normal appearance. He is not ill-appearing, toxic-appearing or diaphoretic.  Cardiovascular:     Rate and Rhythm: Normal rate.     Pulses: Normal pulses.     Heart sounds: Normal heart sounds. No murmur heard.    No gallop.  Pulmonary:     Effort: Pulmonary effort is  normal. No respiratory distress.     Breath sounds: Normal breath sounds. No stridor. No wheezing, rhonchi or rales.  Skin:    General: Skin is warm.     Capillary Refill: Capillary refill takes less than 2 seconds.     Findings: Rash present. Rash is crusting, macular and scaling.  Neurological:     General: No focal deficit present.     Mental Status: He is alert and oriented to person, place, and time. Mental status is at baseline.     Motor: No weakness.  Psychiatric:        Mood and Affect: Mood normal.        Behavior: Behavior normal.        Thought Content: Thought content normal.        Judgment: Judgment normal.    Assessment & Plan:  1. Seborrheic dermatitis of scalp Medication as below. Discussed with patient how to apply shampoo. Discussed with patient to return if symptoms do not improve.  - ketoconazole (NIZORAL) 2 % shampoo; Apply 1 Application topically 2 (two) times a week.  Dispense: 120 mL; Refill: 0  The above assessment and management plan was discussed with the patient. The patient verbalized understanding of and has agreed to the management plan using shared-decision making. Patient is aware to call the clinic if they develop any new symptoms or if symptoms fail to  improve or worsen. Patient is aware when to return to the clinic for a follow-up visit. Patient educated on when it is appropriate to go to the emergency department.  Follow up with PCP  Donzetta Kohut, DNP-FNP [place] [address] [place], [place] [address] [phone]

## 2022-11-07 ENCOUNTER — Ambulatory Visit (INDEPENDENT_AMBULATORY_CARE_PROVIDER_SITE_OTHER): Payer: 59 | Admitting: Family Medicine

## 2022-11-07 ENCOUNTER — Encounter: Payer: Self-pay | Admitting: Family Medicine

## 2022-11-07 VITALS — BP 132/92 | HR 86 | Ht 64.0 in | Wt 164.0 lb

## 2022-11-07 DIAGNOSIS — Z0001 Encounter for general adult medical examination with abnormal findings: Secondary | ICD-10-CM

## 2022-11-07 DIAGNOSIS — Z8249 Family history of ischemic heart disease and other diseases of the circulatory system: Secondary | ICD-10-CM

## 2022-11-07 DIAGNOSIS — B001 Herpesviral vesicular dermatitis: Secondary | ICD-10-CM | POA: Diagnosis not present

## 2022-11-07 DIAGNOSIS — F339 Major depressive disorder, recurrent, unspecified: Secondary | ICD-10-CM

## 2022-11-07 DIAGNOSIS — Z0000 Encounter for general adult medical examination without abnormal findings: Secondary | ICD-10-CM

## 2022-11-07 MED ORDER — VALACYCLOVIR HCL 1 G PO TABS: 1000.0000 mg | ORAL_TABLET | Freq: Two times a day (BID) | ORAL | 2 refills | Status: DC

## 2022-11-07 MED ORDER — HYDROXYZINE HCL 25 MG PO TABS: 25.0000 mg | ORAL_TABLET | Freq: Three times a day (TID) | ORAL | 3 refills | Status: DC | PRN

## 2022-11-07 NOTE — Progress Notes (Signed)
BP (!) 132/92   Pulse 86   Ht 5\' 4"  (1.626 m)   Wt 164 lb (74.4 kg)   SpO2 98%   BMI 28.15 kg/m    Subjective:   Patient ID: Eric Gomez, male    DOB: 06-Sep-1970, 52 y.o.   MRN: [id number]  HPI: [name] is a 52 y.o. male presenting on 11/07/2022 for Medical Management of Chronic Issues (CPE)   HPI Physical exam Patient denies any chest pain, shortness of breath, headaches or vision issues, abdominal complaints, diarrhea, nausea, vomiting, or joint issues.  Have a family history of disease and wants to go ahead and do cardiac screening, will do a referral for him.  He wants to do a cardiac CT scan.   Relevant past medical, surgical, family and social history reviewed and updated as indicated. Interim medical history since our last visit reviewed. Allergies and medications reviewed and updated.  Review of Systems  Constitutional:  Negative for chills and fever.  Eyes:  Negative for visual disturbance.  Respiratory:  Negative for shortness of breath and wheezing.   Cardiovascular:  Negative for chest pain and leg swelling.  Musculoskeletal:  Negative for back pain and gait problem.  Skin:  Negative for rash.  Neurological:  Negative for dizziness, weakness and light-headedness.  All other systems reviewed and are negative.   Per HPI unless specifically indicated above   Allergies as of 11/07/2022       Reactions   Bee Venom         Medication List        Accurate as of November 07, 2022 11:41 AM. If you have any questions, ask your nurse or doctor.          STOP taking these medications    ketoconazole 2 % shampoo Commonly known as: Nizoral Stopped by: Elige Radon Sharma Lawrance, MD       TAKE these medications    clobetasol 0.05 % external solution Commonly known as: TEMOVATE Apply 1 Application topically 2 (two) times daily as needed.   hydrOXYzine 25 MG tablet Commonly known as: ATARAX Take 1 tablet (25 mg total) by mouth 3 (three) times daily as  needed for anxiety.   valACYclovir 1000 MG tablet Commonly known as: VALTREX Take 1 tablet (1,000 mg total) by mouth 2 (two) times daily.         Objective:   BP (!) 132/92   Pulse 86   Ht 5\' 4"  (1.626 m)   Wt 164 lb (74.4 kg)   SpO2 98%   BMI 28.15 kg/m   Wt Readings from Last 3 Encounters:  11/07/22 164 lb (74.4 kg)  07/27/22 176 lb 3.2 oz (79.9 kg)  09/30/20 137 lb 6.4 oz (62.3 kg)    Physical Exam Vitals and nursing note reviewed.  Constitutional:      General: He is not in acute distress.    Appearance: He is well-developed. He is not diaphoretic.  Eyes:     General: No scleral icterus.    Conjunctiva/sclera: Conjunctivae normal.  Neck:     Thyroid: No thyromegaly.  Cardiovascular:     Rate and Rhythm: Normal rate and regular rhythm.     Heart sounds: Normal heart sounds. No murmur heard. Pulmonary:     Effort: Pulmonary effort is normal. No respiratory distress.     Breath sounds: Normal breath sounds. No wheezing.  Musculoskeletal:        General: Normal range of motion.     Cervical  back: Neck supple.  Lymphadenopathy:     Cervical: No cervical adenopathy.  Skin:    General: Skin is warm and dry.     Findings: No rash.  Neurological:     Mental Status: He is alert and oriented to person, place, and time.     Coordination: Coordination normal.  Psychiatric:        Behavior: Behavior normal.       Assessment & Plan:   Problem List Items Addressed This Visit       Other   Depression, recurrent (HCC)   Relevant Medications   hydrOXYzine (ATARAX) 25 MG tablet   Other Visit Diagnoses     Annual physical exam    -  Primary   Relevant Orders   CBC with Differential/Platelet   CMP14+EGFR   Lipid panel   PSA, total and free   Ambulatory referral to Cardiology   Recurrent cold sores       Relevant Medications   valACYclovir (VALTREX) 1000 MG tablet   Family history of cardiac disorder       Relevant Orders   Ambulatory referral to  Cardiology       Will do blood work today, seems to be doing well, refilled medications.  Patient wants to do cardiac screening so we will do referral for possible cardiac CT. Follow up plan: Return in about 1 year (around 11/07/2023), or if symptoms worsen or fail to improve, for physical exam.  Counseling provided for all of the vaccine components Orders Placed This Encounter  Procedures   CBC with Differential/Platelet   CMP14+EGFR   Lipid panel   PSA, total and free   Ambulatory referral to Cardiology    Arville Care, MD [facility] 11/07/2022, 11:41 AM

## 2022-11-08 LAB — CBC WITH DIFFERENTIAL/PLATELET
Basophils Absolute: 0.1 10*3/uL (ref 0.0–0.2)
Basos: 1 %
EOS (ABSOLUTE): 0.1 10*3/uL (ref 0.0–0.4)
Eos: 1 %
Hematocrit: 46.4 % (ref 37.5–51.0)
Hemoglobin: 15.3 g/dL (ref 13.0–17.7)
Immature Grans (Abs): 0 10*3/uL (ref 0.0–0.1)
Immature Granulocytes: 0 %
Lymphocytes Absolute: 1.5 10*3/uL (ref 0.7–3.1)
Lymphs: 23 %
MCH: 28.8 pg (ref 26.6–33.0)
MCHC: 33 g/dL (ref 31.5–35.7)
MCV: 87 fL (ref 79–97)
Monocytes Absolute: 0.5 10*3/uL (ref 0.1–0.9)
Monocytes: 7 %
Neutrophils Absolute: 4.2 10*3/uL (ref 1.4–7.0)
Neutrophils: 68 %
Platelets: 222 10*3/uL (ref 150–450)
RBC: 5.31 x10E6/uL (ref 4.14–5.80)
RDW: 14 % (ref 11.6–15.4)
WBC: 6.3 10*3/uL (ref 3.4–10.8)

## 2022-11-08 LAB — PSA, TOTAL AND FREE
PSA, Free Pct: 18 %
PSA, Free: 0.27 ng/mL
Prostate Specific Ag, Serum: 1.5 ng/mL (ref 0.0–4.0)

## 2022-11-08 LAB — CMP14+EGFR
ALT: 31 IU/L (ref 0–44)
AST: 23 IU/L (ref 0–40)
Albumin: 4.5 g/dL (ref 4.1–5.1)
Alkaline Phosphatase: 69 IU/L (ref 44–121)
BUN/Creatinine Ratio: 13 (ref 9–20)
BUN: 12 mg/dL (ref 6–24)
Bilirubin Total: 0.3 mg/dL (ref 0.0–1.2)
CO2: 24 mmol/L (ref 20–29)
Calcium: 9.7 mg/dL (ref 8.7–10.2)
Chloride: 103 mmol/L (ref 96–106)
Creatinine, Ser: 0.94 mg/dL (ref 0.76–1.27)
Globulin, Total: 2.1 g/dL (ref 1.5–4.5)
Glucose: 89 mg/dL (ref 70–99)
Potassium: 4.9 mmol/L (ref 3.5–5.2)
Sodium: 141 mmol/L (ref 134–144)
Total Protein: 6.6 g/dL (ref 6.0–8.5)
eGFR: 99 mL/min/{1.73_m2} (ref >59)

## 2022-11-08 LAB — LIPID PANEL
Chol/HDL Ratio: 4.8 ratio (ref 0.0–5.0)
Cholesterol, Total: 236 mg/dL — ABNORMAL HIGH (ref 100–199)
HDL: 49 mg/dL (ref >39)
LDL Chol Calc (NIH): 177 mg/dL — ABNORMAL HIGH (ref 0–99)
Triglycerides: 58 mg/dL (ref 0–149)
VLDL Cholesterol Cal: 10 mg/dL (ref 5–40)

## 2022-11-10 ENCOUNTER — Other Ambulatory Visit: Payer: Self-pay

## 2022-11-10 MED ORDER — ROSUVASTATIN CALCIUM 5 MG PO TABS: 5.0000 mg | ORAL_TABLET | Freq: Every evening | ORAL | 3 refills | Status: AC

## 2023-02-22 ENCOUNTER — Encounter: Payer: Self-pay | Admitting: Cardiology

## 2023-02-22 ENCOUNTER — Ambulatory Visit (INDEPENDENT_AMBULATORY_CARE_PROVIDER_SITE_OTHER): Payer: 59 | Admitting: Cardiology

## 2023-02-22 VITALS — BP 120/90 | HR 72 | Ht 64.0 in | Wt 168.0 lb

## 2023-02-22 DIAGNOSIS — Z8249 Family history of ischemic heart disease and other diseases of the circulatory system: Secondary | ICD-10-CM | POA: Diagnosis not present

## 2023-02-22 NOTE — Progress Notes (Signed)
Cardiology Office Note   Date:  02/22/2023   ID:  Eric Gomez, DOB 1970-05-27, MRN [id number]  PCP:  Dettinger, Elige Radon, MD  Cardiologist:   None Referring:  Dettinger, Elige Radon, MD  No chief complaint on file.     History of Present Illness: Eric Gomez is a 52 y.o. male who presents for evaluation of dyslipidemia.   He was referred by Dettinger, Elige Radon, MD .  I saw him in 2012 for an abnormal EKG.  He had right axis deviation.  He had no significant symptoms at that point.  No further testing.  He comes back because he has dyslipidemia.  His LDL is 177.  His paternal grandfather had early onset coronary disease.  The patient himself has not been tested in the past for any cardiovascular problems.  He does do activities such as swinging a sledgehammer at work.  This does not bring on chest pressure, neck or arm discomfort.  He does not describe palpitations, presyncope or syncope.  He had no PND or orthopnea.  He has had some weight gain over the years.   Past Medical History:  Diagnosis Date   Allergic rhinitis    Anxiety    on meds    Past Surgical History:  Procedure Laterality Date   REFRACTIVE SURGERY     VASECTOMY  2007     Current Outpatient Medications  Medication Sig Dispense Refill   clobetasol (TEMOVATE) 0.05 % external solution Apply 1 Application topically 2 (two) times daily as needed.     hydrOXYzine (ATARAX) 25 MG tablet Take 1 tablet (25 mg total) by mouth 3 (three) times daily as needed for anxiety. 30 tablet 3   valACYclovir (VALTREX) 1000 MG tablet Take 1 tablet (1,000 mg total) by mouth 2 (two) times daily. 20 tablet 2   rosuvastatin (CRESTOR) 5 MG tablet Take 1 tablet (5 mg total) by mouth at bedtime. (Patient not taking: Reported on 02/22/2023) 90 tablet 3   No current facility-administered medications for this visit.    Allergies:   Bee venom    Social History:  The patient  reports that he has quit smoking. He has never used  smokeless tobacco. He reports that he does not currently use alcohol. He reports that he does not use drugs.   Family History:  The patient's family history includes Cancer in his mother.    ROS:  Please see the history of present illness.   Otherwise, review of systems are positive for none.   All other systems are reviewed and negative.    PHYSICAL EXAM: VS:  BP (!) 120/90   Pulse 72   Ht 5\' 4"  (1.626 m)   Wt 168 lb (76.2 kg)   BMI 28.84 kg/m  , BMI Body mass index is 28.84 kg/m. GENERAL:  Well appearing HEENT:  Pupils equal round and reactive, fundi not visualized, oral mucosa unremarkable NECK:  No jugular venous distention, waveform within normal limits, carotid upstroke brisk and symmetric, no bruits, no thyromegaly LYMPHATICS:  No cervical, inguinal adenopathy LUNGS:  Clear to auscultation bilaterally BACK:  No CVA tenderness CHEST:  Unremarkable HEART:  PMI not displaced or sustained,S1 and S2 within normal limits, no S3, no S4, no clicks, no rubs, very soft left upper sternal border early systolic murmur, no diastolic murmurs ABD:  Flat, positive bowel sounds normal in frequency in pitch, no bruits, no rebound, no guarding, no midline pulsatile mass, no hepatomegaly, no splenomegaly EXT:  2 plus  pulses throughout, no edema, no cyanosis no clubbing SKIN:  No rashes no nodules NEURO:  Cranial nerves II through XII grossly intact, motor grossly intact throughout [facility]:  Cognitively intact, oriented to person place and time    EKG:  EKG Interpretation Date/Time:  Wednesday February 22 2023 11:22:24 EDT Ventricular Rate:  72 PR Interval:  186 QRS Duration:  88 QT Interval:  380 QTC Calculation: 416 R Axis:   87  Text Interpretation: Normal sinus rhythm RSR' or QR pattern in V1 suggests right ventricular conduction delay Rightward axis Confirmed by Rollene Rotunda ([id number]) on 02/22/2023 11:43:46 AM     Recent Labs: 11/07/2022: ALT 31; BUN 12; Creatinine, Ser 0.94;  Hemoglobin 15.3; Platelets 222; Potassium 4.9; Sodium 141    Lipid Panel    Component Value Date/Time   CHOL 236 (H) 11/07/2022 1152   TRIG 58 11/07/2022 1152   HDL 49 11/07/2022 1152   CHOLHDL 4.8 11/07/2022 1152   LDLCALC 177 (H) 11/07/2022 1152      Wt Readings from Last 3 Encounters:  02/22/23 168 lb (76.2 kg)  11/07/22 164 lb (74.4 kg)  07/27/22 176 lb 3.2 oz (79.9 kg)      Other studies Reviewed: Additional studies/ records that were reviewed today include: Labs. Review of the above records demonstrates:  Please see elsewhere in the note.     ASSESSMENT AND PLAN:  Dyslipidemia: The patient has an LDL of 177.  We talked about a Mediterranean plant forward diet.  Goals of therapy will be determined by coronary calcium score.  I am suggesting that he is probably not can get or I want him to be without a statin.  He was given a prescription for Crestor previously but had not started this.  He will wait to hear the results of the score and we will talk further about this.  Overweight: He does have mild weight issue and again we talked about diet and exercise.  He is physically active at work which should count for his exercise.   Current medicines are reviewed at length with the patient today.  The patient does not have concerns regarding medicines.  The following changes have been made:  no change  Labs/ tests ordered today include:   Orders Placed This Encounter  Procedures   CT CARDIAC SCORING (SELF PAY ONLY)   EKG 12-Lead     Disposition:   FU with with me as needed based on the results of the calcium score.   Signed, Rollene Rotunda, MD  02/22/2023 12:03 PM    [facility]

## 2023-02-22 NOTE — Patient Instructions (Signed)
Medication Instructions:  The current medical regimen is effective;  continue present plan and medications.  *If you need a refill on your cardiac medications before your next appointment, please call your pharmacy*  Testing/Procedures: Your physician has requested that you have a Coronary Calcium score which is completed by CT. Cardiac computed tomography (CT) is a painless test that uses an x-ray machine to take clear, detailed pictures of your heart. There are no instructions for this testing.  You may eat/drink and take your normal medications this day.  The cost of the testing is $99 due at the time of your appointment. You will ne contacted to be scheduled.  This will be completed at our [place] location.  Follow-Up: At [facility], you and your health needs are our priority.  As part of our continuing mission to provide you with exceptional heart care, we have created designated Provider Care Teams.  These Care Teams include your primary Cardiologist (physician) and Advanced Practice Providers (APPs -  Physician Assistants and Nurse Practitioners) who all work together to provide you with the care you need, when you need it.  We recommend signing up for the patient portal called "MyChart".  Sign up information is provided on this After Visit Summary.  MyChart is used to connect with patients for Virtual Visits (Telemedicine).  Patients are able to view lab/test results, encounter notes, upcoming appointments, etc.  Non-urgent messages can be sent to your provider as well.   To learn more about what you can do with MyChart, go to [email or web address].    Your next appointment:   Follow up as needed.

## 2023-03-22 ENCOUNTER — Ambulatory Visit (HOSPITAL_BASED_OUTPATIENT_CLINIC_OR_DEPARTMENT_OTHER)
Admission: RE | Admit: 2023-03-22 | Discharge: 2023-03-22 | Disposition: A | Discharge status: Home / Self Care | Payer: 59 | Source: Ambulatory Visit | Attending: Cardiology | Admitting: Cardiology

## 2023-03-22 DIAGNOSIS — Z8249 Family history of ischemic heart disease and other diseases of the circulatory system: Secondary | ICD-10-CM | POA: Insufficient documentation

## 2023-03-27 ENCOUNTER — Other Ambulatory Visit: Payer: Self-pay | Admitting: *Deleted

## 2023-03-27 DIAGNOSIS — Z8249 Family history of ischemic heart disease and other diseases of the circulatory system: Secondary | ICD-10-CM

## 2023-05-08 ENCOUNTER — Ambulatory Visit: Payer: 59 | Admitting: Family Medicine

## 2023-05-16 ENCOUNTER — Telehealth (INDEPENDENT_AMBULATORY_CARE_PROVIDER_SITE_OTHER): Payer: Self-pay | Admitting: Otolaryngology

## 2023-05-16 NOTE — Telephone Encounter (Signed)
 LVM to confirm appt & location [id number] afm

## 2023-05-17 ENCOUNTER — Ambulatory Visit (INDEPENDENT_AMBULATORY_CARE_PROVIDER_SITE_OTHER): Payer: 59

## 2023-07-20 ENCOUNTER — Ambulatory Visit: Admitting: Family Medicine

## 2023-07-20 ENCOUNTER — Ambulatory Visit

## 2023-07-21 ENCOUNTER — Ambulatory Visit: Admitting: Family Medicine

## 2023-08-31 ENCOUNTER — Ambulatory Visit: Admitting: Family Medicine

## 2023-08-31 ENCOUNTER — Ambulatory Visit: Payer: Self-pay

## 2023-08-31 NOTE — Telephone Encounter (Signed)
 E2C2 scheduled appointment.

## 2023-08-31 NOTE — Telephone Encounter (Signed)
  Chief Complaint: sinus pressure/congestion Symptoms: sore throat, cough, brown mucus discharge from nose, nasal congestion, sinus pressure Frequency: x 2 days Pertinent Negatives: Patient denies fever, difficulty breathing Disposition: [] ED /[] Urgent Care (no appt availability in office) / [x] Appointment(In office/virtual)/ []  [facility]/ [] Home Care/ [] Refused Recommended Disposition /[] [facility]/ []  Follow-up with PCP Additional Notes: Patient states he has tried nasal saline wash and nasal congestion, sinus pressure persist. Patient agreeable to acute visit with PCP today.  Copied from CRM [id number]. Topic: Clinical - Red Word Triage >> Aug 31, 2023  8:50 AM Retta Caster wrote: Red Word that prompted transfer to Nurse Triage: 04/29 statrted/Sore Throat/Mucus brown/Congestions Reason for Disposition  [1] Using nasal washes and pain medicine > 24 hours AND [2] sinus pain (around cheekbone or eye) persists  Answer Assessment - Initial Assessment Questions 1. LOCATION: "Where does it hurt?"      Below eyes around nose.  2. ONSET: "When did the sinus pain start?"  (e.g., hours, days)      2 days ago.  3. SEVERITY: "How bad is the pain?"   (Scale 1-10; mild, moderate or severe)   - MILD (1-3): doesn't interfere with normal activities    - MODERATE (4-7): interferes with normal activities (e.g., work or school) or awakens from sleep   - SEVERE (8-10): excruciating pain and patient unable to do any normal activities        Pressure; 4/10.  4. RECURRENT SYMPTOM: "Have you ever had sinus problems before?" If Yes, ask: "When was the last time?" and "What happened that time?"      Yes, he states he gets 1-2 sinus infections per year.  5. NASAL CONGESTION: "Is the nose blocked?" If Yes, ask: "Can you open it or must you breathe through your mouth?"     Yes, he states it pretty much stays blocked up even after blowing his nose.  6. NASAL DISCHARGE: "Do you have discharge  from your nose?" If so ask, "What color?"     Yes, brown color.  7. FEVER: "Do you have a fever?" If Yes, ask: "What is it, how was it measured, and when did it start?"      Denies.  8. OTHER SYMPTOMS: "Do you have any other symptoms?" (e.g., sore throat, cough, earache, difficulty breathing)     Sore throat, cough  9. PREGNANCY: "Is there any chance you are pregnant?" "When was your last menstrual period?"     N/A.  Protocols used: Sinus Pain or Congestion-A-AH

## 2023-11-13 ENCOUNTER — Encounter (INDEPENDENT_AMBULATORY_CARE_PROVIDER_SITE_OTHER): Payer: Self-pay | Admitting: Otolaryngology

## 2023-11-13 ENCOUNTER — Ambulatory Visit (INDEPENDENT_AMBULATORY_CARE_PROVIDER_SITE_OTHER): Admitting: Otolaryngology

## 2023-11-13 VITALS — BP 115/81 | HR 76 | Ht 64.0 in | Wt 154.0 lb

## 2023-11-13 DIAGNOSIS — H9313 Tinnitus, bilateral: Secondary | ICD-10-CM

## 2023-11-13 DIAGNOSIS — H6121 Impacted cerumen, right ear: Secondary | ICD-10-CM

## 2023-11-13 DIAGNOSIS — R42 Dizziness and giddiness: Secondary | ICD-10-CM

## 2023-11-13 NOTE — Progress Notes (Unsigned)
 Patient ID: Eric Gomez, male   DOB: Jan 25, 1971, 53 y.o.   MRN: [id number]  Follow-up: Recurrent dizziness, bilateral tinnitus  HPI: The patient is a 53 year old male who returns today for his follow-up evaluation.  He was last seen 1 year ago.  At that time, he was complaining of recurrent dizziness and bilateral tinnitus.  He was noted to have right-sided BPPV and bilateral high-frequency sensorineural hearing loss.  He was treated with right Epley maneuver.  The strategies to cope with tinnitus were also discussed.  The patient returns today reporting no significant dizziness since his last visit.  He has noted increasing bilateral tinnitus over the past few months.  The tinnitus is constant and nonpulsatile.  Currently he denies any otalgia or otorrhea.   Exam: General: Communicates without difficulty, well nourished, no acute distress. Head: Normocephalic, no evidence injury, no tenderness, facial buttresses intact without stepoff. Face/sinus: No tenderness to palpation and percussion. Facial movement is normal and symmetric. Eyes: PERRL, EOMI. No scleral icterus, conjunctivae clear. Neuro: CN II exam reveals vision grossly intact.  No nystagmus at any point of gaze. Ears: Auricles well formed without lesions.  Right ear cerumen impaction.  The left ear canal and tympanic membrane are normal.  Nose: External evaluation reveals normal support and skin without lesions.  Dorsum is intact.  Anterior rhinoscopy reveals congested mucosa over anterior aspect of inferior turbinates and intact septum.  No purulence noted. Oral:  Oral cavity and oropharynx are intact, symmetric, without erythema or edema.  Mucosa is moist without lesions. Neck: Full range of motion without pain.  There is no significant lymphadenopathy.  No masses palpable.  Thyroid bed within normal limits to palpation.  Parotid glands and submandibular glands equal bilaterally without mass.  Trachea is midline. Neuro:  CN 2-12 grossly intact.   Dix-Hallpike negative.  Procedure: Right ear cerumen disimpaction Anesthesia: None Description: Under the operating microscope, the cerumen is carefully removed with a combination of cerumen currette, alligator forceps, and suction catheters.  After the cerumen is removed, the TMs are noted to be normal.  No mass, erythema, or lesions. The patient tolerated the procedure well.    Assessment: 1.  The patient's dizziness/BPPV has resolved.  2.  Subjectively stable bilateral hearing loss. 3.  Persistent bilateral tinnitus. 4.  Incidental finding of right ear cerumen impaction.  After the disimpaction procedure, both tympanic membranes and middle ear spaces are noted to be normal.  Plan: 1.  The physical exam findings are reviewed with the patient. 2.  Otomicroscopy with right ear cerumen disimpaction. 3.  The strategies to cope with tinnitus, including the use of masker, hearing aids, tinnitus retraining therapy, and avoidance of caffeine and alcohol are discussed. 4.  The patient is encouraged to call with any questions or concerns.

## 2023-11-14 DIAGNOSIS — H6121 Impacted cerumen, right ear: Secondary | ICD-10-CM | POA: Insufficient documentation

## 2023-11-14 DIAGNOSIS — R42 Dizziness and giddiness: Secondary | ICD-10-CM | POA: Insufficient documentation

## 2023-11-14 DIAGNOSIS — H9313 Tinnitus, bilateral: Secondary | ICD-10-CM | POA: Insufficient documentation

## 2024-02-07 ENCOUNTER — Encounter: Payer: Self-pay | Admitting: Family Medicine

## 2024-02-07 ENCOUNTER — Ambulatory Visit (INDEPENDENT_AMBULATORY_CARE_PROVIDER_SITE_OTHER): Admitting: Family Medicine

## 2024-02-07 VITALS — BP 125/82 | HR 68 | Ht 64.0 in | Wt 155.0 lb

## 2024-02-07 DIAGNOSIS — E782 Mixed hyperlipidemia: Secondary | ICD-10-CM

## 2024-02-07 DIAGNOSIS — Z125 Encounter for screening for malignant neoplasm of prostate: Secondary | ICD-10-CM

## 2024-02-07 DIAGNOSIS — Z0000 Encounter for general adult medical examination without abnormal findings: Secondary | ICD-10-CM

## 2024-02-07 DIAGNOSIS — Z0001 Encounter for general adult medical examination with abnormal findings: Secondary | ICD-10-CM

## 2024-02-07 DIAGNOSIS — E785 Hyperlipidemia, unspecified: Secondary | ICD-10-CM | POA: Insufficient documentation

## 2024-02-07 NOTE — Progress Notes (Signed)
 BP 125/82   Pulse 68   Ht 5' 4 (1.626 m)   Wt 155 lb (70.3 kg)   SpO2 97%   BMI 26.61 kg/m    Subjective:   Patient ID: Eric Gomez, male    DOB: Sep 07, 1970, 53 y.o.   MRN: [id number]  HPI: Eric Gomez is a 53 y.o. male presenting on 02/07/2024 for Medical Management of Chronic Issues (CPE)   Discussed the use of AI scribe software for clinical note transcription with the patient, who gave verbal consent to proceed.  History of Present Illness   Eric Gomez is a 53 year old male who presents for a recheck and physical exam.  Hyperlipidemia - Discontinued Crestor  in an effort to manage cholesterol through diet and exercise - Previous LDL measured at 170, elevated from baseline of 110-120 - Did not fast prior to last lipid panel - Plans to return for fasting laboratory evaluation  Lower urinary tract symptoms - Decreased urinary stream - Incomplete bladder emptying - Attributes symptoms to aging - Concerned about prostate health due to family history of cancer (mother had cancer)  Lifestyle modification - Adhering to diet and exercise regimen for cholesterol management - Did not specify details of exercise routine          Relevant past medical, surgical, family and social history reviewed and updated as indicated. Interim medical history since our last visit reviewed. Allergies and medications reviewed and updated.  Review of Systems  Constitutional:  Negative for chills and fever.  HENT:  Negative for ear pain and tinnitus.   Eyes:  Negative for pain and discharge.  Respiratory:  Negative for cough, shortness of breath and wheezing.   Cardiovascular:  Negative for chest pain, palpitations and leg swelling.  Gastrointestinal:  Negative for abdominal pain, blood in stool, constipation and diarrhea.  Genitourinary:  Negative for dysuria and hematuria.  Musculoskeletal:  Negative for back pain, gait problem and myalgias.  Skin:  Negative for rash.   Neurological:  Negative for dizziness, weakness and headaches.  Psychiatric/Behavioral:  Negative for suicidal ideas.   All other systems reviewed and are negative.   Per HPI unless specifically indicated above   Allergies as of 02/07/2024       Reactions   Eric Venom         Medication List        Accurate as of February 07, 2024  2:59 PM. If you have any questions, ask your nurse or doctor.          STOP taking these medications    clobetasol 0.05 % external solution Commonly known as: TEMOVATE Stopped by: Fonda LABOR Amond Speranza       TAKE these medications    hydrOXYzine  25 MG tablet Commonly known as: ATARAX  Take 1 tablet (25 mg total) by mouth 3 (three) times daily as needed for anxiety.   rosuvastatin  5 MG tablet Commonly known as: Crestor  Take 1 tablet (5 mg total) by mouth at bedtime.   valACYclovir  1000 MG tablet Commonly known as: VALTREX  Take 1 tablet (1,000 mg total) by mouth 2 (two) times daily.         Objective:   BP 125/82   Pulse 68   Ht 5' 4 (1.626 m)   Wt 155 lb (70.3 kg)   SpO2 97%   BMI 26.61 kg/m   Wt Readings from Last 3 Encounters:  02/07/24 155 lb (70.3 kg)  11/13/23 154 lb (69.9 kg)  02/22/23 168 lb (76.2  kg)    Physical Exam Physical Exam   VITALS: BP- 125/82 HEENT: Minimal cerumen, tympanic membranes normal bilaterally NECK: Thyroid without nodules CHEST: Lungs clear to auscultation bilaterally CARDIOVASCULAR: Regular rate and rhythm, no murmurs EXTREMITIES: No edema, pulses intact bilaterally         Assessment & Plan:   Problem List Items Addressed This Visit       Other   Hyperlipidemia   Relevant Orders   CBC with Differential/Platelet   CMP14+EGFR   Lipid panel   PSA, total and free   Other Visit Diagnoses       Annual physical exam    -  Primary   Relevant Orders   CBC with Differential/Platelet   CMP14+EGFR   Lipid panel   PSA, total and free     Prostate cancer screening       Relevant  Orders   CBC with Differential/Platelet   CMP14+EGFR   Lipid panel   PSA, total and free          Mixed hyperlipidemia LDL previously elevated at 170 mg/dL. Not on Crestor , managing with diet and exercise. Discussed medication if LDL remains high, especially with possible familial hyperlipidemia. - Order fasting lipid panel. - Encourage diet and exercise. - Discuss Crestor  if LDL remains elevated.  Benign prostatic hyperplasia with lower urinary tract symptoms Symptoms of decreased urinary stream and incomplete emptying due to prostate enlargement. Discussed Flomax if symptoms worsen. - Consider Flomax if symptoms worsen.  Screening for prostate cancer Screening discussed. Prefers blood work monitoring.          Follow up plan: Return in about 1 year (around 02/06/2025), or if symptoms worsen or fail to improve, for Physical exam.  Counseling provided for all of the vaccine components Orders Placed This Encounter  Procedures   CBC with Differential/Platelet   CMP14+EGFR   Lipid panel   PSA, total and free    Fonda Levins, MD [facility] 02/07/2024, 2:59 PM

## 2024-02-12 ENCOUNTER — Other Ambulatory Visit

## 2024-02-16 ENCOUNTER — Other Ambulatory Visit

## 2024-02-16 DIAGNOSIS — E782 Mixed hyperlipidemia: Secondary | ICD-10-CM

## 2024-02-16 DIAGNOSIS — Z125 Encounter for screening for malignant neoplasm of prostate: Secondary | ICD-10-CM

## 2024-02-16 DIAGNOSIS — Z0000 Encounter for general adult medical examination without abnormal findings: Secondary | ICD-10-CM

## 2024-02-17 LAB — CBC WITH DIFFERENTIAL/PLATELET
Basophils Absolute: 0.1 x10E3/uL (ref 0.0–0.2)
Basos: 1 %
EOS (ABSOLUTE): 0.1 x10E3/uL (ref 0.0–0.4)
Eos: 3 %
Hematocrit: 43.8 % (ref 37.5–51.0)
Hemoglobin: 13.5 g/dL (ref 13.0–17.7)
Immature Grans (Abs): 0 x10E3/uL (ref 0.0–0.1)
Immature Granulocytes: 0 %
Lymphocytes Absolute: 1.7 x10E3/uL (ref 0.7–3.1)
Lymphs: 33 %
MCH: 24.3 pg — ABNORMAL LOW (ref 26.6–33.0)
MCHC: 30.8 g/dL — ABNORMAL LOW (ref 31.5–35.7)
MCV: 79 fL (ref 79–97)
Monocytes Absolute: 0.5 x10E3/uL (ref 0.1–0.9)
Monocytes: 10 %
Neutrophils Absolute: 2.8 x10E3/uL (ref 1.4–7.0)
Neutrophils: 53 %
Platelets: 297 x10E3/uL (ref 150–450)
RBC: 5.55 x10E6/uL (ref 4.14–5.80)
RDW: 16.5 % — ABNORMAL HIGH (ref 11.6–15.4)
WBC: 5.2 x10E3/uL (ref 3.4–10.8)

## 2024-02-17 LAB — LIPID PANEL
Chol/HDL Ratio: 4.6 ratio (ref 0.0–5.0)
Cholesterol, Total: 237 mg/dL — ABNORMAL HIGH (ref 100–199)
HDL: 52 mg/dL (ref >39)
LDL Chol Calc (NIH): 175 mg/dL — ABNORMAL HIGH (ref 0–99)
Triglycerides: 61 mg/dL (ref 0–149)
VLDL Cholesterol Cal: 10 mg/dL (ref 5–40)

## 2024-02-17 LAB — CMP14+EGFR
ALT: 30 IU/L (ref 0–44)
AST: 28 IU/L (ref 0–40)
Albumin: 4.5 g/dL (ref 3.8–4.9)
Alkaline Phosphatase: 78 IU/L (ref 47–123)
BUN/Creatinine Ratio: 18 (ref 9–20)
BUN: 18 mg/dL (ref 6–24)
Bilirubin Total: 0.4 mg/dL (ref 0.0–1.2)
CO2: 25 mmol/L (ref 20–29)
Calcium: 9.6 mg/dL (ref 8.7–10.2)
Chloride: 100 mmol/L (ref 96–106)
Creatinine, Ser: 0.99 mg/dL (ref 0.76–1.27)
Globulin, Total: 2 g/dL (ref 1.5–4.5)
Glucose: 91 mg/dL (ref 70–99)
Potassium: 5.2 mmol/L (ref 3.5–5.2)
Sodium: 136 mmol/L (ref 134–144)
Total Protein: 6.5 g/dL (ref 6.0–8.5)
eGFR: 92 mL/min/1.73 (ref >59)

## 2024-02-17 LAB — PSA, TOTAL AND FREE
PSA, Free Pct: 21.7 %
PSA, Free: 0.26 ng/mL
Prostate Specific Ag, Serum: 1.2 ng/mL (ref 0.0–4.0)

## 2024-02-23 ENCOUNTER — Ambulatory Visit: Payer: Self-pay | Admitting: Family Medicine

## 2024-03-19 ENCOUNTER — Other Ambulatory Visit: Payer: Self-pay | Admitting: *Deleted

## 2024-03-19 DIAGNOSIS — F339 Major depressive disorder, recurrent, unspecified: Secondary | ICD-10-CM

## 2024-03-19 DIAGNOSIS — B001 Herpesviral vesicular dermatitis: Secondary | ICD-10-CM

## 2024-03-19 MED ORDER — VALACYCLOVIR HCL 1 G PO TABS: 1000.0000 mg | ORAL_TABLET | Freq: Two times a day (BID) | ORAL | 5 refills | Status: AC

## 2024-03-19 NOTE — Telephone Encounter (Signed)
 Refill failed. resent

## 2024-03-19 NOTE — Addendum Note (Signed)
 Addended by: Emyah Roznowski D on: 03/19/2024 11:19 AM   Modules accepted: Orders

## 2024-04-02 ENCOUNTER — Ambulatory Visit: Payer: Self-pay

## 2024-04-02 NOTE — Progress Notes (Unsigned)
 Subjective:  Patient ID: Eric Gomez, male    DOB: 20-Nov-1970, 52 y.o.   MRN: [id number]  Patient Care Team: Dettinger, Fonda LABOR, MD as PCP - General (Family Medicine)   Chief Complaint:  No chief complaint on file.   HPI: Eric Gomez is a 53 y.o. male presenting on 04/03/2024 for No chief complaint on file.   Discussed the use of AI scribe software for clinical note transcription with the patient, who gave verbal consent to proceed.  History of Present Illness       Relevant past medical, surgical, family, and social history reviewed and updated as indicated.  Allergies and medications reviewed and updated. Data reviewed: Chart in Epic.   Past Medical History:  Diagnosis Date   Allergic rhinitis    Anxiety    on meds    Past Surgical History:  Procedure Laterality Date   REFRACTIVE SURGERY     VASECTOMY  2007    Social History   Socioeconomic History   Marital status: Married    Spouse name: Not on file   Number of children: 2   Years of education: Not on file   Highest education level: Not on file  Occupational History   Occupation: [employer]  Tobacco Use   Smoking status: Former   Smokeless tobacco: Never  Advertising Account Planner   Vaping status: Never Used  Substance and Sexual Activity   Alcohol use: Not Currently   Drug use: Never   Sexual activity: Yes    Partners: Male  Other Topics Concern   Not on file  Social History Narrative   Lives with wife.  Two children.      Social Drivers of Corporate Investment Banker Strain: Not on file  Food Insecurity: Not on file  Transportation Needs: Not on file  Physical Activity: Not on file  Stress: Not on file  Social Connections: Not on file  Intimate Partner Violence: Not on file    Outpatient Encounter Medications as of 04/03/2024  Medication Sig   hydrOXYzine  (ATARAX ) 25 MG tablet TAKE 1 TABLET BY MOUTH 3 TIMES DAILY AS NEEDED FOR ANXIETY.   rosuvastatin  (CRESTOR ) 5 MG tablet Take 1  tablet (5 mg total) by mouth at bedtime. (Patient not taking: Reported on 11/13/2023)   valACYclovir  (VALTREX ) 1000 MG tablet Take 1 tablet (1,000 mg total) by mouth 2 (two) times daily.   No facility-administered encounter medications on file as of 04/03/2024.    Allergies  Allergen Reactions   Bee Venom     Pertinent ROS per HPI, otherwise unremarkable      Objective:  There were no vitals taken for this visit.   Wt Readings from Last 3 Encounters:  02/07/24 155 lb (70.3 kg)  11/13/23 154 lb (69.9 kg)  02/22/23 168 lb (76.2 kg)    Physical Exam Physical Exam      Results for orders placed or performed in visit on 02/16/24  PSA, total and free   Collection Time: 02/16/24 10:04 AM  Result Value Ref Range   Prostate Specific Ag, Serum 1.2 0.0 - 4.0 ng/mL   PSA, Free 0.26 N/A ng/mL   PSA, Free Pct 21.7 %  Lipid panel   Collection Time: 02/16/24 10:04 AM  Result Value Ref Range   Cholesterol, Total 237 (H) 100 - 199 mg/dL   Triglycerides 61 0 - 149 mg/dL   HDL 52 >60 mg/dL   VLDL Cholesterol Cal 10 5 - 40 mg/dL  LDL Chol Calc (NIH) 175 (H) 0 - 99 mg/dL   Chol/HDL Ratio 4.6 0.0 - 5.0 ratio  CMP14+EGFR   Collection Time: 02/16/24 10:04 AM  Result Value Ref Range   Glucose 91 70 - 99 mg/dL   BUN 18 6 - 24 mg/dL   Creatinine, Ser 9.00 0.76 - 1.27 mg/dL   eGFR 92 >40 fO/fpw/8.26   BUN/Creatinine Ratio 18 9 - 20   Sodium 136 134 - 144 mmol/L   Potassium 5.2 3.5 - 5.2 mmol/L   Chloride 100 96 - 106 mmol/L   CO2 25 20 - 29 mmol/L   Calcium  9.6 8.7 - 10.2 mg/dL   Total Protein 6.5 6.0 - 8.5 g/dL   Albumin 4.5 3.8 - 4.9 g/dL   Globulin, Total 2.0 1.5 - 4.5 g/dL   Bilirubin Total 0.4 0.0 - 1.2 mg/dL   Alkaline Phosphatase 78 47 - 123 IU/L   AST 28 0 - 40 IU/L   ALT 30 0 - 44 IU/L  CBC with Differential/Platelet   Collection Time: 02/16/24 10:04 AM  Result Value Ref Range   WBC 5.2 3.4 - 10.8 x10E3/uL   RBC 5.55 4.14 - 5.80 x10E6/uL   Hemoglobin 13.5 13.0 - 17.7  g/dL   Hematocrit 56.1 62.4 - 51.0 %   MCV 79 79 - 97 fL   MCH 24.3 (L) 26.6 - 33.0 pg   MCHC 30.8 (L) 31.5 - 35.7 g/dL   RDW 83.4 (H) 88.3 - 84.5 %   Platelets 297 150 - 450 x10E3/uL   Neutrophils 53 Not Estab. %   Lymphs 33 Not Estab. %   Monocytes 10 Not Estab. %   Eos 3 Not Estab. %   Basos 1 Not Estab. %   Neutrophils Absolute 2.8 1.4 - 7.0 x10E3/uL   Lymphocytes Absolute 1.7 0.7 - 3.1 x10E3/uL   Monocytes Absolute 0.5 0.1 - 0.9 x10E3/uL   EOS (ABSOLUTE) 0.1 0.0 - 0.4 x10E3/uL   Basophils Absolute 0.1 0.0 - 0.2 x10E3/uL   Immature Granulocytes 0 Not Estab. %   Immature Grans (Abs) 0.0 0.0 - 0.1 x10E3/uL       Pertinent labs & imaging results that were available during my care of the patient were reviewed by me and considered in my medical decision making.  Assessment & Plan:  There are no diagnoses linked to this encounter.   Assessment and Plan Assessment & Plan       Continue all other maintenance medications.  Follow up plan: No follow-ups on file.   Continue healthy lifestyle choices, including diet (rich in fruits, vegetables, and lean proteins, and low in salt and simple carbohydrates) and exercise (at least 30 minutes of moderate physical activity daily).  Educational handout given for ***  The above assessment and management plan was discussed with the patient. The patient verbalized understanding of and has agreed to the management plan. Patient is aware to call the clinic if they develop any new symptoms or if symptoms persist or worsen. Patient is aware when to return to the clinic for a follow-up visit. Patient educated on when it is appropriate to go to the emergency department.   Hudson Majkowski [place] Thompson, DNP [facility] [address] [place], [place] [address] [phone]

## 2024-04-02 NOTE — Telephone Encounter (Signed)
 FYI Only or Action Required?: FYI only for provider: appointment scheduled on 04/03/24.  Patient was last seen in primary care on 02/07/2024 by Dettinger, Fonda LABOR, MD.  Called Nurse Triage reporting Cough and Sinusitis.  Symptoms began several days ago.  Interventions attempted: Nothing.  Symptoms are: gradually worsening.  Triage Disposition: See Physician Within 24 Hours  Patient/caregiver understands and will follow disposition?: Yes  Copied from CRM #[id number]. Topic: Clinical - Red Word Triage >> Apr 02, 2024  1:28 PM Carla L wrote: Red Word that prompted transfer to Nurse Triage: fever, sore throat, sinus pressure, and some colored drainage Reason for Disposition  [1] Sinus pain (not just congestion) AND [2] fever  Answer Assessment - Initial Assessment Questions Pt reports onset on sinus congestion with 4/10 headache and sinus pain, cough, and low grade temperature of 99.5 F 3 days ago. Denies severe SOB or facial swelling. Reports hx of sinus infections requiring abxs. Scheduled appt with different provider tomorrow at home office d/t no PCP availability within timeframe. Advised UC or ED for worsening symptoms.   1. LOCATION: Where does it hurt?      Right sided headache  2. ONSET: When did the sinus pain start?  (e.g., hours, days)      3 days ago  3. SEVERITY: How bad is the pain?   (Scale 0-10; or none, mild, moderate or severe)     4/10 headache and sinus pain  4. RECURRENT SYMPTOM: Have you ever had sinus problems before? If Yes, ask: When was the last time? and What happened that time?      Has had current symptoms in the past requiring abxs  5. NASAL CONGESTION: Is the nose blocked? If Yes, ask: Can you open it or must you breathe through your mouth?     Not completely blocked  6. NASAL DISCHARGE: Do you have discharge from your nose? If so ask, What color?     Yellow brown color  7. FEVER: Do you have a fever? If Yes, ask: What is it,  how was it measured, and when did it start?      99.5 F  8. OTHER SYMPTOMS: Do you have any other symptoms? (e.g., sore throat, cough, earache, difficulty breathing)     Sore throat and cough.  Protocols used: Sinus Pain or Congestion-A-AH

## 2024-04-03 ENCOUNTER — Encounter: Payer: Self-pay | Admitting: Nurse Practitioner

## 2024-04-03 ENCOUNTER — Ambulatory Visit: Admitting: Nurse Practitioner

## 2024-04-03 VITALS — BP 123/80 | HR 75 | Temp 97.0°F | Ht 64.0 in | Wt 161.2 lb

## 2024-04-03 DIAGNOSIS — J0110 Acute frontal sinusitis, unspecified: Secondary | ICD-10-CM

## 2024-04-03 MED ORDER — AZITHROMYCIN 250 MG PO TABS: ORAL_TABLET | ORAL | 0 refills | Status: AC

## 2024-04-03 MED ORDER — AZELASTINE HCL 0.1 % NA SOLN: 1.0000 | Freq: Two times a day (BID) | NASAL | 5 refills | Status: AC

## 2025-02-07 ENCOUNTER — Encounter: Payer: Self-pay | Admitting: Family Medicine
